# Patient Record
Sex: Female | Born: 1990 | Hispanic: Yes | Marital: Single | Smoking: Never smoker
Health system: Southern US, Community
[De-identification: ages and names within clinical notes are randomized; demographics above are authoritative.]

## PROBLEM LIST (undated history)

## (undated) DIAGNOSIS — F329 Major depressive disorder, single episode, unspecified: Secondary | ICD-10-CM

## (undated) DIAGNOSIS — Z8632 Personal history of gestational diabetes: Secondary | ICD-10-CM

## (undated) DIAGNOSIS — R87629 Unspecified abnormal cytological findings in specimens from vagina: Secondary | ICD-10-CM

## (undated) DIAGNOSIS — Z789 Other specified health status: Secondary | ICD-10-CM

## (undated) DIAGNOSIS — D649 Anemia, unspecified: Secondary | ICD-10-CM

## (undated) DIAGNOSIS — Z87440 Personal history of urinary (tract) infections: Secondary | ICD-10-CM

## (undated) DIAGNOSIS — F32A Depression, unspecified: Secondary | ICD-10-CM

## (undated) HISTORY — DX: Personal history of urinary (tract) infections: Z87.440

## (undated) HISTORY — PX: NO PAST SURGERIES: SHX2092

## (undated) HISTORY — DX: Personal history of gestational diabetes: Z86.32

## (undated) HISTORY — DX: Unspecified abnormal cytological findings in specimens from vagina: R87.629

## (undated) HISTORY — DX: Anemia, unspecified: D64.9

---

## 2012-10-17 DIAGNOSIS — O1490 Unspecified pre-eclampsia, unspecified trimester: Secondary | ICD-10-CM

## 2012-10-17 HISTORY — DX: Unspecified pre-eclampsia, unspecified trimester: O14.90

## 2015-06-15 ENCOUNTER — Other Ambulatory Visit: Payer: Self-pay | Admitting: Advanced Practice Midwife

## 2015-06-15 DIAGNOSIS — Z369 Encounter for antenatal screening, unspecified: Secondary | ICD-10-CM

## 2015-06-28 DIAGNOSIS — Z9149 Other personal history of psychological trauma, not elsewhere classified: Secondary | ICD-10-CM | POA: Insufficient documentation

## 2015-07-11 ENCOUNTER — Ambulatory Visit: Payer: Self-pay

## 2015-07-11 ENCOUNTER — Ambulatory Visit: Admission: RE | Admit: 2015-07-11 | Payer: Self-pay | Source: Ambulatory Visit

## 2015-07-15 ENCOUNTER — Encounter: Payer: Self-pay | Admitting: Emergency Medicine

## 2015-07-15 ENCOUNTER — Emergency Department
Admission: EM | Admit: 2015-07-15 | Discharge: 2015-07-16 | Disposition: A | Discharge status: Home / Self Care | Payer: Self-pay | Attending: Emergency Medicine | Admitting: Emergency Medicine

## 2015-07-15 ENCOUNTER — Other Ambulatory Visit: Payer: Self-pay | Admitting: Advanced Practice Midwife

## 2015-07-15 DIAGNOSIS — Z3490 Encounter for supervision of normal pregnancy, unspecified, unspecified trimester: Secondary | ICD-10-CM

## 2015-07-15 DIAGNOSIS — O9989 Other specified diseases and conditions complicating pregnancy, childbirth and the puerperium: Secondary | ICD-10-CM | POA: Insufficient documentation

## 2015-07-15 DIAGNOSIS — R42 Dizziness and giddiness: Secondary | ICD-10-CM | POA: Insufficient documentation

## 2015-07-15 DIAGNOSIS — R112 Nausea with vomiting, unspecified: Secondary | ICD-10-CM

## 2015-07-15 DIAGNOSIS — R1032 Left lower quadrant pain: Secondary | ICD-10-CM | POA: Insufficient documentation

## 2015-07-15 DIAGNOSIS — Z3A12 12 weeks gestation of pregnancy: Secondary | ICD-10-CM | POA: Insufficient documentation

## 2015-07-15 DIAGNOSIS — O210 Mild hyperemesis gravidarum: Secondary | ICD-10-CM | POA: Insufficient documentation

## 2015-07-15 DIAGNOSIS — Z79899 Other long term (current) drug therapy: Secondary | ICD-10-CM | POA: Insufficient documentation

## 2015-07-15 DIAGNOSIS — Z3402 Encounter for supervision of normal first pregnancy, second trimester: Secondary | ICD-10-CM

## 2015-07-15 NOTE — ED Notes (Signed)
Patient reports being approximately [redacted] weeks pregnant.  States tonight she took an antibiotic for the first time for UTI.  45 minutes prior to that she reports she had taken her prenatal vitamin.  Reports immediately after taking the antibiotic began having abd pain and nausea and vomiting.

## 2015-07-15 NOTE — ED Notes (Signed)
Webster, MD at bedside. 

## 2015-07-16 ENCOUNTER — Emergency Department: Payer: Self-pay

## 2015-07-16 LAB — URINALYSIS COMPLETE WITH MICROSCOPIC (ARMC ONLY)
BILIRUBIN URINE: NEGATIVE
GLUCOSE, UA: 50 mg/dL — AB
Hgb urine dipstick: NEGATIVE
NITRITE: NEGATIVE
Protein, ur: NEGATIVE mg/dL
SPECIFIC GRAVITY, URINE: 1.012 (ref 1.005–1.030)
pH: 7 (ref 5.0–8.0)

## 2015-07-16 LAB — CHLAMYDIA/NGC RT PCR (ARMC ONLY)
CHLAMYDIA TR: NOT DETECTED
N GONORRHOEAE: NOT DETECTED

## 2015-07-16 LAB — COMPREHENSIVE METABOLIC PANEL
ALK PHOS: 115 U/L (ref 38–126)
ALT: 16 U/L (ref 14–54)
ANION GAP: 7 (ref 5–15)
AST: 24 U/L (ref 15–41)
Albumin: 4.3 g/dL (ref 3.5–5.0)
BUN: 5 mg/dL — ABNORMAL LOW (ref 6–20)
CALCIUM: 9.4 mg/dL (ref 8.9–10.3)
CO2: 22 mmol/L (ref 22–32)
CREATININE: 0.46 mg/dL (ref 0.44–1.00)
Chloride: 105 mmol/L (ref 101–111)
GFR calc non Af Amer: >60 mL/min (ref >60)
Glucose, Bld: 91 mg/dL (ref 65–99)
Potassium: 3.2 mmol/L — ABNORMAL LOW (ref 3.5–5.1)
SODIUM: 134 mmol/L — AB (ref 135–145)
Total Bilirubin: 0.2 mg/dL — ABNORMAL LOW (ref 0.3–1.2)
Total Protein: 8.6 g/dL — ABNORMAL HIGH (ref 6.5–8.1)

## 2015-07-16 LAB — HCG, QUANTITATIVE, PREGNANCY: hCG, Beta Chain, Quant, S: 91912 m[IU]/mL — ABNORMAL HIGH (ref <5)

## 2015-07-16 LAB — WET PREP, GENITAL
Clue Cells Wet Prep HPF POC: NONE SEEN
TRICH WET PREP: NONE SEEN
Yeast Wet Prep HPF POC: NONE SEEN

## 2015-07-16 LAB — CBC
HEMATOCRIT: 38.8 % (ref 35.0–47.0)
Hemoglobin: 12.9 g/dL (ref 12.0–16.0)
MCH: 25.8 pg — ABNORMAL LOW (ref 26.0–34.0)
MCHC: 33.2 g/dL (ref 32.0–36.0)
MCV: 77.7 fL — ABNORMAL LOW (ref 80.0–100.0)
Platelets: 404 10*3/uL (ref 150–440)
RBC: 5 MIL/uL (ref 3.80–5.20)
RDW: 14.4 % (ref 11.5–14.5)
WBC: 15 10*3/uL — AB (ref 3.6–11.0)

## 2015-07-16 LAB — ABO/RH: ABO/RH(D): O POS

## 2015-07-16 MED ORDER — ACETAMINOPHEN 325 MG PO TABS
650.0000 mg | ORAL_TABLET | Freq: Once | ORAL | Status: AC
  Administered 2015-07-16: 650 mg via ORAL
  Filled 2015-07-16: qty 2

## 2015-07-16 MED ORDER — METOCLOPRAMIDE HCL 5 MG PO TABS: 5.0000 mg | ORAL_TABLET | Freq: Three times a day (TID) | ORAL | Status: DC | PRN

## 2015-07-16 MED ORDER — METOCLOPRAMIDE HCL 5 MG/ML IJ SOLN
10.0000 mg | Freq: Once | INTRAMUSCULAR | Status: AC
  Administered 2015-07-16: 10 mg via INTRAVENOUS
  Filled 2015-07-16: qty 2

## 2015-07-16 MED ORDER — SODIUM CHLORIDE 0.9 % IV BOLUS (SEPSIS)
1000.0000 mL | Freq: Once | INTRAVENOUS | Status: AC
  Administered 2015-07-16: 1000 mL via INTRAVENOUS

## 2015-07-16 NOTE — ED Provider Notes (Signed)
[facility] Emergency Department Provider Note  ____________________________________________  Time seen: Approximately 2345 PM  I have reviewed the triage vital signs and the nursing notes.   HISTORY  Chief Complaint Abdominal Pain and Emesis    HPI Brittany Becker is a 24 y.o. female who comes into the hospital today with abdominal pain, nausea and vomiting. The patient is [redacted] weeks pregnant with her first child and was her doctor's office today.The patient received her flu shot, a PPD placed and was placed on antibiotics for a urinary tract infection. The patient reports that tonight she took her first dose of medication and she started feeling dizzy with vomiting and LLQ pain. The patient rates the pain as a 10/10 in intensity. She reports that 4 years ago she have cysts on her ovaries, cervix and uterus and was told that she would not be able to have children. The patient reports that she has never had this pain in the past. The patient also has a history of an abnormal pap smear. The patient and her mother were concerned about a reaction to the medication she she decided to come in for evaluation.   History reviewed. No pertinent past medical history.  There are no active problems to display for this patient.   History reviewed. No pertinent past surgical history.  Current Outpatient Rx  Name  Route  Sig  Dispense  Refill  . nitrofurantoin (MACRODANTIN) 100 MG capsule   Oral   Take 100 mg by mouth 2 (two) times daily. For 7 days         . Prenatal Vit-Fe Fumarate-FA (PRENATAL MULTIVITAMIN) TABS tablet   Oral   Take 1 tablet by mouth daily at 12 noon.         . metoCLOPramide (REGLAN) 5 MG tablet   Oral   Take 1 tablet (5 mg total) by mouth every 8 (eight) hours as needed for nausea or vomiting.   15 tablet   0     Allergies Review of patient's allergies indicates no known allergies.  No family history on file.  Social  History Social History  Substance Use Topics  . Smoking status: Never Smoker   . Smokeless tobacco: None  . Alcohol Use: No    Review of Systems Constitutional: No fever/chills Eyes: No visual changes. ENT: No sore throat. Cardiovascular: Denies chest pain. Respiratory: Denies shortness of breath. Gastrointestinal: abdominal pain.   nausea,  vomiting.  No diarrhea.  No constipation. Genitourinary: Negative for dysuria. Musculoskeletal: Negative for back pain. Skin: Negative for rash. Neurological: dizziness 10-point ROS otherwise negative.  ____________________________________________   PHYSICAL EXAM:  VITAL SIGNS: ED Triage Vitals  Enc Vitals Group     BP 07/15/15 2346 126/80 mmHg     Pulse Rate 07/15/15 2315 83     Resp 07/15/15 2313 20     Temp 07/15/15 2313 98.2 F (36.8 C)     Temp Source 07/15/15 2313 Oral     SpO2 07/15/15 2315 98 %     Weight 07/15/15 2313 143 lb (64.864 kg)     Height 07/15/15 2313 5' (1.524 m)     Head Cir --      Peak Flow --      Pain Score 07/15/15 2315 8     Pain Loc --      Pain Edu? --      Excl. in [place]? --     Constitutional: Alert and oriented. Well appearing and in  moderate distress. Eyes: Conjunctivae are normal. PERRL. EOMI. Head: Atraumatic. Nose: No congestion/rhinnorhea. Mouth/Throat: Mucous membranes are moist.  Oropharynx non-erythematous. Cardiovascular: Normal rate, regular rhythm. Grossly normal heart sounds.  Good peripheral circulation. Respiratory: Normal respiratory effort.  No retractions. Lungs CTAB. Gastrointestinal: Soft and nontender. No distention. Positive bowel sounds Genitourinary: normal external genitalia, scant discharge, lesion on cervix. Musculoskeletal: No lower extremity tenderness nor edema.   Neurologic:  Normal speech and language.  Skin:  Skin is warm, dry and intact. Psychiatric: Mood and affect are normal.   ____________________________________________   LABS (all labs ordered are  listed, but only abnormal results are displayed)  Labs Reviewed  WET PREP, GENITAL - Abnormal; Notable for the following:    WBC, Wet Prep HPF POC MODERATE (*)    All other components within normal limits  CBC - Abnormal; Notable for the following:    WBC 15.0 (*)    MCV 77.7 (*)    MCH 25.8 (*)    All other components within normal limits  HCG, QUANTITATIVE, PREGNANCY - Abnormal; Notable for the following:    hCG, Beta Chain, Quant, S 91912 (*)    All other components within normal limits  URINALYSIS COMPLETEWITH MICROSCOPIC ([facility] ONLY) - Abnormal; Notable for the following:    Color, Urine AMBER (*)    APPearance HAZY (*)    Glucose, UA 50 (*)    Ketones, ur 1+ (*)    Leukocytes, UA 2+ (*)    Bacteria, UA RARE (*)    Squamous Epithelial / LPF 6-30 (*)    All other components within normal limits  COMPREHENSIVE METABOLIC PANEL - Abnormal; Notable for the following:    Sodium 134 (*)    Potassium 3.2 (*)    BUN 5 (*)    Total Protein 8.6 (*)    Total Bilirubin 0.2 (*)    All other components within normal limits  CHLAMYDIA/NGC RT PCR ([facility] ONLY)  ABO/RH   ____________________________________________  EKG  none ____________________________________________  RADIOLOGY  US ob: Single IUP, estimated gestational age [redacted] weeks, 5 days ____________________________________________   PROCEDURES  Procedure(s) performed: None  Critical Care performed: No  ____________________________________________   INITIAL IMPRESSION / ASSESSMENT AND PLAN / ED COURSE  Pertinent labs & imaging results that were available during my care of the patient were reviewed by me and considered in my medical decision making (see chart for details).  This is a 24 year old female who comes in today with lower abdominal pain after starting on nitrofurantoin. The concern is that the patient who has not yet had an ultrasound may have an ectopic or ruptured cyst or twisting cyst causing  her symptoms. I did give the patient a dose of Tylenol for pain as well as Reglan for nausea. The pain did improve after the Tylenol. I informed the patient is a results of the ultrasound and informed her that at this point I do not know why she is having the symptoms she should follow back up with her primary care physician. The patient and her mother agrees. The patient will be discharged home. ____________________________________________   FINAL CLINICAL IMPRESSION(S) / ED DIAGNOSES  Final diagnoses:  LLQ pain  Non-intractable vomiting with nausea, vomiting of unspecified type  Pregnancy      Loney Hering, MD 07/16/15 [phone]

## 2015-07-16 NOTE — ED Notes (Addendum)
Pt's BP noted to be much lower than it was upon arrival; MD made aware and new orders received for 1L NS bolus.

## 2015-07-16 NOTE — Discharge Instructions (Signed)
Dolor abdominal en adultos (Abdominal Pain, Adult) El dolor puede tener muchas causas. Normalmente la causa del dolor abdominal no es una enfermedad y Teacher, English as a foreign language sin Clinical research associate. Frecuentemente puede controlarse y tratarse en casa. Su mdico le Chartered certified accountant examen fsico y posiblemente solicite anlisis de sangre y radiografas para ayudar a Teacher, adult education la gravedad de su dolor. Sin embargo, en [employer], debe transcurrir ms tiempo antes de que se pueda Pension scheme manager una causa evidente del dolor. Antes de llegar a ese punto, es posible que su mdico no sepa si necesita ms pruebas o un tratamiento ms profundo. INSTRUCCIONES PARA EL CUIDADO EN EL HOGAR  Est atento al dolor para ver si hay cambios. Las siguientes indicaciones ayudarn a Chief Strategy Officer que pueda sentir:  [place] solo medicamentos de venta libre o recetados, segn las indicaciones del mdico.  No tome laxantes a menos que se lo haya indicado su mdico.  Pruebe con Ardelia Mems dieta lquida absoluta (caldo, t o agua) segn se lo indique su mdico. Introduzca gradualmente una dieta normal, segn su tolerancia. SOLICITE ATENCIN MDICA SI:  Tiene dolor abdominal sin explicacin.  Tiene dolor abdominal relacionado con nuseas o diarrea.  Tiene dolor cuando orina o defeca.  Experimenta dolor abdominal que lo despierta de noche.  Tiene dolor abdominal que empeora o mejora cuando come alimentos.  Tiene dolor abdominal que empeora cuando come alimentos grasosos.  Tiene fiebre. SOLICITE ATENCIN MDICA DE INMEDIATO SI:   El dolor no desaparece en un plazo mximo de 2horas.  No deja de (vomitar).  El Social research officer, government se siente solo en partes del abdomen, como el lado derecho o la parte inferior izquierda del abdomen.  Evaca materia fecal sanguinolenta o negra, de aspecto alquitranado. ASEGRESE DE QUE:  Comprende estas instrucciones.  Controlar su afeccin.  Recibir ayuda de inmediato si no mejora o si empeora.   Esta  informacin no tiene Marine scientist el consejo del mdico. Asegrese de hacerle al mdico cualquier pregunta que tenga.   Document Released: 09/17/2005 Document Revised: 10/08/2014 Elsevier Interactive Patient Education 2016 [place] trimestre de Media planner (First Trimester of Pregnancy) El primer trimestre de Media planner se extiende desde la semana1 hasta el final de la semana12 (mes1 al mes3). Una semana despus de que un espermatozoide fecunda un vulo, este se implantar en la pared uterina. Este embrin comenzar a Medical laboratory scientific officer convertirse en un beb. Sus genes y los de su pareja forman el beb. Los genes del varn determinan si ser un nio o una nia. Entre la semana6 y [place], se forman los ojos y [place], y los latidos del corazn pueden verse en la ecografa. Al final de las 12semanas, todos los rganos del beb estn formados.  Ahora que est embarazada, querr hacer todo lo que est a su alcance para tener un beb sano. Dos de las cosas ms importantes son Lucilla Edin buena atencin prenatal y seguir las indicaciones del mdico. La atencin prenatal incluye toda la asistencia mdica que usted recibe antes del nacimiento del beb. Esta ayudar a prevenir, Hydrographic surveyor y tratar cualquier problema durante el embarazo y [place]. CAMBIOS EN EL ORGANISMO Su organismo atraviesa por muchos cambios durante el [place], y estos varan de Ardelia Mems mujer a Theatre manager.   Al principio, puede aumentar o bajar algunos kilos.  Puede tener Higher education careers adviser (nuseas) y vomitar. Si no puede controlar los vmitos, llame al mdico.  Puede cansarse con facilidad.  Es posible que tenga dolores de cabeza que pueden aliviarse con los [employer]  el mdico le permita tomar.  Puede orinar con mayor frecuencia. El dolor al orinar puede significar que usted tiene una infeccin de la vejiga.  Debido al [place], puede tener acidez estomacal.  Puede estar estreida, ya que ciertas hormonas  enlentecen los movimientos de los msculos que [employer] desechos a travs de los intestinos.  Pueden aparecer hemorroides o abultarse e hincharse las venas (venas varicosas).  Las [employer] pueden empezar a Engineer, site y Scientist, forensic. Los pezones pueden sobresalir ms, y el tejido que los rodea (areola) tornarse ms oscuro.  Las Production manager y estar sensibles al cepillado y al hilo dental.  Pueden aparecer zonas oscuras o manchas (cloasma, mscara del Media planner) en el rostro que probablemente se atenuarn despus del nacimiento del beb.  Los perodos menstruales se interrumpirn.  Tal vez no tenga apetito.  Puede sentir un fuerte deseo de consumir ciertos alimentos.  Puede tener cambios a Engineer, site a da, por ejemplo, por momentos puede estar emocionada por el Media planner y por otros preocuparse porque algo pueda salir mal con el embarazo o el beb.  Tendr sueos ms vvidos y extraos.  Tal vez haya cambios en el cabello que pueden incluir su engrosamiento, crecimiento rpido y cambios en la textura. A algunas mujeres tambin se les cae el cabello durante o despus del [place], o tienen el cabello seco o fino. Lo ms probable es que el cabello se le normalice despus del nacimiento del beb. QU DEBE ESPERAR EN LAS CONSULTAS PRENATALES Durante una visita prenatal de rutina:  La pesarn para asegurarse de que usted y el beb estn creciendo normalmente.  Le controlarn la presin arterial.  Le medirn el abdomen para controlar el desarrollo del beb.  Se escucharn los latidos cardacos a partir de la semana10 o la12 de embarazo, aproximadamente.  Se analizarn los resultados de los estudios solicitados en visitas anteriores. El mdico puede preguntarle:  Cmo se siente.  Si siente los movimientos del beb.  Si ha tenido sntomas anormales, como prdida de lquido, [place], dolores de cabeza intensos o clicos abdominales.  Si est consumiendo algn  producto que contenga tabaco, como cigarrillos, tabaco de Higher education careers adviser y Psychologist, sport and exercise.  Si tiene [employer]. Otros estudios que pueden realizarse durante el primer trimestre incluyen lo siguiente:  Anlisis de sangre para determinar el tipo de sangre y Hydrographic surveyor la presencia de infecciones previas. Adems, se los usar para controlar si los niveles de hierro son bajos (anemia) y Teacher, adult education los anticuerpos Rh. En una etapa ms avanzada del [place], se harn anlisis de sangre para saber si tiene diabetes, junto con otros estudios si surgen problemas.  Anlisis de orina para detectar infecciones, diabetes o protenas en la orina.  Una ecografa para confirmar que el beb crece y se desarrolla correctamente.  Una amniocentesis para diagnosticar posibles problemas genticos.  Estudios del feto para descartar espina bfida y sndrome de [place].  Es posible que necesite otras pruebas adicionales.  Prueba del VIH (virus de inmunodeficiencia humana). Los exmenes prenatales de rutina incluyen la prueba de deteccin del VIH, a menos que decida no Radiation protection practitioner. INSTRUCCIONES PARA EL CUIDADO EN EL HOGAR  Medicamentos:  Siga las indicaciones del mdico en relacin con el uso de medicamentos. Durante el embarazo, hay medicamentos que pueden tomarse y 7 que no.  Tome las vitaminas prenatales como se le indic.  Si est estreida, tome un laxante suave, si el mdico lo [place]. Dieta  Consuma alimentos balanceados. Elija alimentos variados, como carne o protenas de  origen vegetal, pescado, leche y productos lcteos descremados, verduras, frutas y panes y Psychologist, prison and probation services. El mdico la ayudar a Office manager cantidad de peso que puede [place].  No coma carne cruda ni quesos sin cocinar. Estos elementos contienen bacterias que pueden causar defectos congnitos en el beb.  La ingesta diaria de cuatro o cinco comidas pequeas en lugar de tres comidas abundantes puede ayudar a  [employer] nuseas y los vmitos. Si empieza a tener nuseas, comer algunas galletas saladas puede ser de [place]. Beber lquidos [employer] comidas en lugar de tomarlos durante las comidas tambin puede ayudar a Actor las nuseas y los vmitos.  Si est estreida, consuma alimentos con alto contenido de fibra, como verduras y frutas frescas, y Psychologist, prison and probation services. Beba suficiente lquido para Consulting civil engineer orina clara o de color amarillo plido. Actividad y Conservation officer, historic buildings ejercicio solamente como se lo haya indicado el mdico. El ejercicio la ayudar a:  Technical sales engineer.  Mantenerse en forma.  Estar preparada para el trabajo de parto y [place].  Los dolores, los clicos en la parte baja del abdomen o los calambres en la cintura son un buen indicio de que debe dejar de Insurance risk surveyor. Consulte al mdico antes de seguir haciendo ejercicios normales.  Intente no estar de pie [employer]. Mueva las piernas con frecuencia si debe estar de pie en un lugar durante mucho tiempo.  Evite levantar pesos [employer].  Use zapatos de tacones bajos y [place].  Puede seguir teniendo [employer], excepto que el mdico le indique lo contrario. Alivio del dolor o las molestias  Use un sostn que le brinde buen soporte si siente dolor a la palpacin [employer].  Dese baos de asiento con agua tibia para Best boy o las molestias causadas por las hemorroides. Use crema antihemorroidal si el mdico se lo permite.  Descanse con las piernas elevadas si tiene calambres o dolor de cintura.  Si tiene venas varicosas en las piernas, use medias de descanso. Eleve los pies durante 49minutos, 3 o 4veces por da. Limite la cantidad de sal en su dieta. Cuidados prenatales  Programe las visitas prenatales para la semana12 de [place]. Generalmente se programan cada mes al principio y se hacen ms frecuentes en los 2 ltimos meses antes del parto.  Escriba sus  preguntas. Llvelas cuando concurra a las visitas prenatales.  Concurra a todas las visitas prenatales como se lo haya indicado el mdico. Seguridad  Colquese el cinturn de seguridad cuando conduzca.  Haga una lista de los nmeros de telfono de Freight forwarder, que [employer]J's nmeros de telfono de familiares, [place], el hospital y los departamentos de polica y bomberos. Consejos generales  Pdale al mdico que la derive a clases de educacin prenatal en su localidad. Debe comenzar a tomar las clases antes de Dietitian en el mes6 de embarazo.  Pida ayuda si tiene necesidades nutricionales o de asesoramiento Solicitor. El mdico puede aconsejarla o derivarla a especialistas para que la ayuden con diferentes necesidades.  No se d baos de inmersin en agua caliente, baos turcos ni saunas.  No se haga duchas vaginales ni use tampones o toallas higinicas perfumadas.  No mantenga las piernas cruzadas durante mucho tiempo.  Evite el contacto con las bandejas sanitarias de los gatos y la tierra que estos animales usan. Estos elementos contienen bacterias que pueden causar defectos congnitos al beb y la posible prdida del feto debido a un aborto espontneo o [employer]  fetal.  No fume, no consuma hierbas ni medicamentos que no hayan sido recetados por el mdico. Las sustancias qumicas que estos productos contienen afectan la formacin y el desarrollo del beb.  No consuma ningn producto que contenga tabaco, lo que incluye cigarrillos, tabaco de Higher education careers adviser y Psychologist, sport and exercise. Si necesita ayuda para dejar de fumar, consulte al [employer]. Puede recibir asesoramiento y otro tipo de recursos para dejar de fumar.  Programe una cita con el dentista. En su casa, lvese los dientes con un cepillo dental blando y psese el hilo dental con suavidad. SOLICITE ATENCIN MDICA SI:   Tiene mareos.  Siente clicos leves, presin en la pelvis o dolor persistente en el abdomen.  Tiene nuseas,  vmitos o diarrea persistentes.  Tiene secrecin vaginal con mal olor.  Siente dolor al [employer].  Tiene el rostro, las [place], las piernas o los tobillos ms hinchados. SOLICITE ATENCIN MDICA DE INMEDIATO SI:   Tiene fiebre.  Tiene una prdida de lquido por la vagina.  Tiene sangrado o pequeas prdidas vaginales.  Siente dolor intenso o clicos en el abdomen.  Sube o baja de peso rpidamente.  Vomita sangre de color rojo brillante o material que parezca granos de caf.  Ha estado expuesta a la rubola y no ha sufrido la enfermedad.  Ha estado expuesta a la quinta enfermedad o a la varicela.  Tiene un dolor de cabeza intenso.  Le falta el aire.  Sufre cualquier tipo de traumatismo, por ejemplo, debido a una cada o un accidente automovilstico.   Esta informacin no tiene Marine scientist el consejo del mdico. Asegrese de hacerle al mdico cualquier pregunta que tenga.   Document Released: 06/27/2005 Document Revised: 10/08/2014 Elsevier Interactive Patient Education 2016 [place] y vmitos (Nausea and Vomiting)  Naseas significa que tiene ganas de vomitar. Elvmitoes un reflejo por el que los contenidos del estmago salen por la boca.  CUIDADOS EN EL HOGAR  Tome los medicamentos como le indic el mdico.  No se esfuerce por comer. Sin embargo, es necesario que tome lquidos.  Si tiene ganas de comer, [place] dieta normal, segn las indicaciones del mdico.  Coma arroz, trigo, patatas, pan, carnes magras, yogur, frutas y vegetales.  Evite los alimentos [employer].  Beba gran cantidad de lquidos para mantener la orina de tono claro o color amarillo plido.  Consulte a su mdico como reponer la prdida de lquidos (rehidratacin). Los signos de prdida de lquidos (deshidratacin) son:  Catering manager sed.  Tiene los labios y la boca secos.  Est mareado.  La orina es [place].  Orina menos que lo normal.  Se siente  confundido.  La respiracin o la frecuencia cardaca son rpidas. SOLICITE AYUDA DE INMEDIATO SI:  Observa sangre en su vmito.  La materia fecal (heces)es negra o de color rojo.  Siente un dolor de cabeza muy intenso o el cuello rgido.  Se siente confundido.  Siente dolor muy fuerte en el vientre (abdominal).  Tiene dolor en el pecho o dificultad para respirar.  No ha orinado durante 8 horas.  Tiene la piel fra y pegajosa.  Sigue vomitando despus de 24  48 horas.  Tiene fiebre. ASEGRESE QUE:  Comprende estas instrucciones.  Controlar la enfermedad.  Puede solicitar ayuda de inmediato si los sntomas no mejoran, o si empeoran.   Esta informacin no tiene Marine scientist el consejo del mdico. Asegrese de hacerle al mdico cualquier pregunta que tenga.   Document Released: 03/07/2010 Document Revised: 12/10/2011  Elsevier Interactive Patient Education ©2016 Elsevier Inc. ° °

## 2015-07-29 ENCOUNTER — Ambulatory Visit: Admission: RE | Admit: 2015-07-29 | Payer: Self-pay | Source: Ambulatory Visit

## 2015-08-22 ENCOUNTER — Ambulatory Visit
Admission: RE | Admit: 2015-08-22 | Discharge: 2015-08-22 | Disposition: A | Discharge status: Home / Self Care | Payer: Self-pay | Source: Ambulatory Visit | Attending: Maternal & Fetal Medicine | Admitting: Maternal & Fetal Medicine

## 2015-08-22 VITALS — BP 105/62 | HR 75 | Temp 98.0°F | Wt 146.0 lb

## 2015-08-22 DIAGNOSIS — Z3A18 18 weeks gestation of pregnancy: Secondary | ICD-10-CM | POA: Insufficient documentation

## 2015-08-22 DIAGNOSIS — D473 Essential (hemorrhagic) thrombocythemia: Secondary | ICD-10-CM | POA: Insufficient documentation

## 2015-08-22 DIAGNOSIS — O26892 Other specified pregnancy related conditions, second trimester: Secondary | ICD-10-CM | POA: Insufficient documentation

## 2015-08-22 DIAGNOSIS — R7989 Other specified abnormal findings of blood chemistry: Secondary | ICD-10-CM

## 2015-08-22 HISTORY — DX: Major depressive disorder, single episode, unspecified: F32.9

## 2015-08-22 HISTORY — DX: Other specified health status: Z78.9

## 2015-08-22 HISTORY — DX: Depression, unspecified: F32.A

## 2015-08-22 LAB — CBC WITH DIFFERENTIAL/PLATELET
Basophils Absolute: 0 10*3/uL (ref 0–0.1)
Basophils Relative: 0 %
EOS ABS: 0 10*3/uL (ref 0–0.7)
EOS PCT: 0 %
HCT: 35.2 % (ref 35.0–47.0)
Hemoglobin: 11.7 g/dL — ABNORMAL LOW (ref 12.0–16.0)
LYMPHS ABS: 3 10*3/uL (ref 1.0–3.6)
Lymphocytes Relative: 28 %
MCH: 26 pg (ref 26.0–34.0)
MCHC: 33.1 g/dL (ref 32.0–36.0)
MCV: 78.4 fL — ABNORMAL LOW (ref 80.0–100.0)
MONO ABS: 0.7 10*3/uL (ref 0.2–0.9)
MONOS PCT: 6 %
Neutro Abs: 6.9 10*3/uL — ABNORMAL HIGH (ref 1.4–6.5)
Neutrophils Relative %: 66 %
PLATELETS: 433 10*3/uL (ref 150–440)
RBC: 4.49 MIL/uL (ref 3.80–5.20)
RDW: 14.5 % (ref 11.5–14.5)
WBC: 10.6 10*3/uL (ref 3.6–11.0)

## 2015-08-22 LAB — FERRITIN: FERRITIN: 44 ng/mL (ref 11–307)

## 2015-08-22 NOTE — Progress Notes (Addendum)
[facility] Consultation   Chief Complaint: increased platelet count  HPI: Brittany Becker is a 24 y.o. G1P0 at [redacted]w[redacted]d by LMP who presents in consultation from the [facility] for an increased platelet count.  Monthly serial platelet counts have ranged from 402 to 460.    Gynecologic History:  Patient's last menstrual period was 04/15/2015 (exact date).  The patient was told, based an [place] in [place] that she had ovarian cysts and may not conceive. She had involuntary infertility for 3 years.  Past Medical History: Patient  has a past medical history of Medical history non-contributory and Depression. She sees a Engineer, water at [address].  Her current mood is good and she is not taking any medication.  Past Surgical History: She  has past surgical history that includes No past surgeries.   Medications: PN vitamins  Allergies: Patient has No Known Allergies. She does report having nausea and vomiting after taking an antibiotic this pregnancy for UTI.    Social History: Patient  reports that she has never smoked. She has never used smokeless tobacco. She reports that she does not drink alcohol or use illicit drugs.  The patient is not working currently.  She moved here 05/11/2015 (15 weeks ago) with her nieces.  Her partner remains in [place] and she has not seen him since she left.  Family History: family history includes Heart disease in her other; Mood Disorder in her mother.   Review of Systems A full 12 point review of systems was negative or as noted in the History of Present Illness.  Physical Exam: BP 105/62 mmHg  Pulse 75  Temp(Src) 98 F (36.7 C)  Wt 146 lb (66.225 kg)  LMP 04/15/2015 (Exact Date) Fundal height is at the umbilicus FHR [id number]  CBC 99991111 - Hb 12.6, Hct 37.4, WBC 12.7, and platelets 437K  Asessement: 24 yo gravida 1 at [redacted]w[redacted]d by dates with: 1. Recent travel from [place] 15 weeks ago 2. Increased platelet count 3. LGSIL  Pap  Recommendations: 1. Recent travel from [place] 15 weeks ago --  The patient reports having had Zika testing at [facility] --  Since she is beyond 12 weeks from exposure, we would not test her now --  We will obtain growth measurements and study intracranial anatomy at the time of a detailed ultrasound.  She was scheduled for this. --  We would suggest an ultrasound for fetal growth at 34-[redacted] weeks gestation 2. Increased platelet count --  Via the interpreter, the patient was counseled about the role of platelets in blood clotting --  The patient was counseled that a normal platelet count in pregnancy is 225,000-250,000 --  Her platelet count is not yet sufficiently elevated to justify a hematological evaluation, but may place her at increased risk of thrombosis and adverse fetal outcomes.  I recommended ASA 81 mg per day and gave her a prescription. --  The patient was counseled that the most common reason for thrombocytosis is reactive thrombocytosis, usually due to anemia.  While the patient is not anemic, she may be iron deficient.  We will obtain a ferritin, CBC, peripheral smear and hemoglobin electrophoresis today. --  She should have follow-up CBC every 4-8 weeks.  If her platelet count exceeds 500,000, additional therapy may be required and I would suggest consultation with Dr. Grayland Ormond, hematologist at [facility].  3. LGSIL Pap -- Follow-up per [facility]  Total time spent with the patient was 40 minutes with greater than 50% spent  in counseling and coordination of care.  We appreciate this interesting consult and will be happy to be involved in the ongoing care of Brittany Becker in anyway her obstetricians desire.  Erasmo Score, MD [facility]  Addendum:  Platelet count 433; essentially normal smear, ferritin 44 (normal); Hb electrophoresis - normal  Erasmo Score, MD [facility]

## 2015-08-23 LAB — HEMOGLOBINOPATHY EVALUATION
HGB A2 QUANT: 2.6 % (ref 0.7–3.1)
Hgb A: 97.4 % (ref 94.0–98.0)
Hgb C: 0 %
Hgb F Quant: 0 % (ref 0.0–2.0)
Hgb S Quant: 0 %

## 2015-08-23 LAB — PATHOLOGIST SMEAR REVIEW

## 2015-09-05 ENCOUNTER — Other Ambulatory Visit: Payer: Self-pay

## 2015-09-05 ENCOUNTER — Ambulatory Visit
Admission: RE | Admit: 2015-09-05 | Discharge: 2015-09-05 | Disposition: A | Discharge status: Home / Self Care | Payer: Self-pay | Source: Ambulatory Visit | Attending: Obstetrics & Gynecology | Admitting: Obstetrics & Gynecology

## 2015-09-05 DIAGNOSIS — O352XX1 Maternal care for (suspected) hereditary disease in fetus, fetus 1: Secondary | ICD-10-CM | POA: Insufficient documentation

## 2015-09-05 DIAGNOSIS — Z3A20 20 weeks gestation of pregnancy: Secondary | ICD-10-CM | POA: Insufficient documentation

## 2015-09-05 DIAGNOSIS — O359XX1 Maternal care for (suspected) fetal abnormality and damage, unspecified, fetus 1: Secondary | ICD-10-CM

## 2015-09-05 DIAGNOSIS — D473 Essential (hemorrhagic) thrombocythemia: Secondary | ICD-10-CM | POA: Insufficient documentation

## 2015-09-05 DIAGNOSIS — Z36 Encounter for antenatal screening of mother: Secondary | ICD-10-CM | POA: Insufficient documentation

## 2015-09-12 NOTE — Addendum Note (Signed)
Encounter addended by: Dellia Nims, MD on: 09/12/2015  9:11 AM<BR>     Documentation filed: Notes Section

## 2015-11-01 LAB — HM HIV SCREENING LAB: HM HIV Screening: NEGATIVE

## 2015-11-21 DIAGNOSIS — R87612 Low grade squamous intraepithelial lesion on cytologic smear of cervix (LGSIL): Secondary | ICD-10-CM | POA: Insufficient documentation

## 2015-12-12 ENCOUNTER — Ambulatory Visit: Payer: Self-pay

## 2016-06-26 ENCOUNTER — Encounter (HOSPITAL_COMMUNITY): Payer: Self-pay

## 2017-07-07 IMAGING — US US OB COMP LESS 14 WK
1 series · 14 of 28 positions shown · non-contrast
Comparison: None.

CLINICAL DATA: Left lower quadrant pain for 5 hours. Estimated
gestational age by LMP is 13 weeks 1 day. Quantitative beta HCG is
[DATE].

EXAM:
OBSTETRIC <14 WK ULTRASOUND
TECHNIQUE: Transabdominal ultrasound was performed for evaluation of the
gestation as well as the maternal uterus and adnexal regions.

[Series 1: us ob comp less 14 wk · 0.20mm/px · 14 of 46 slices shown]
[im 2/46]
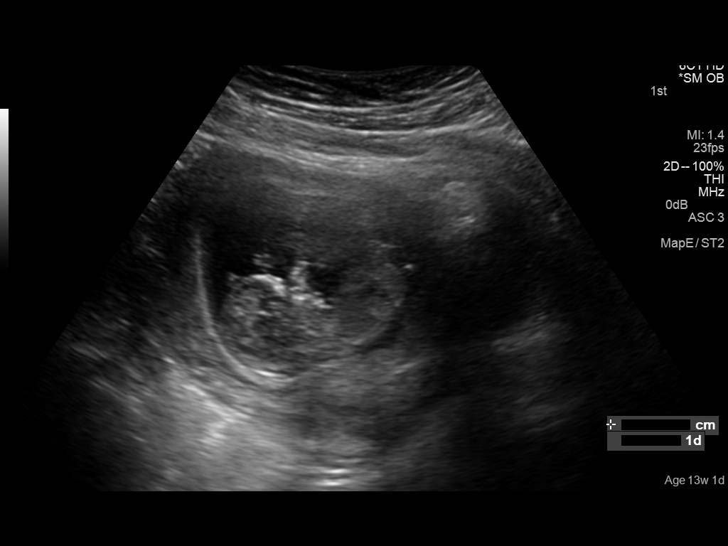
[im 6/46]
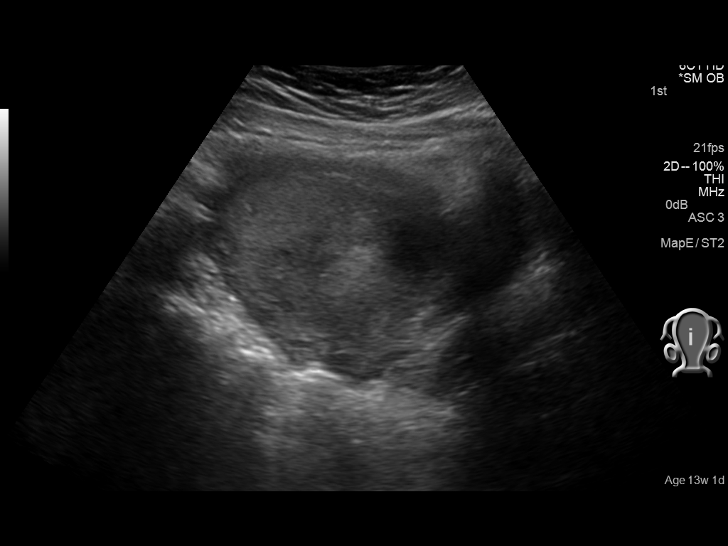
[im 9/46]
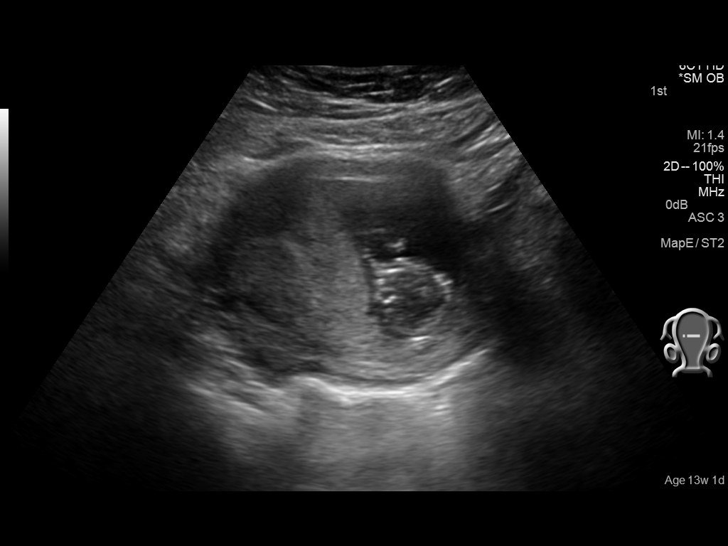
[im 12/46]
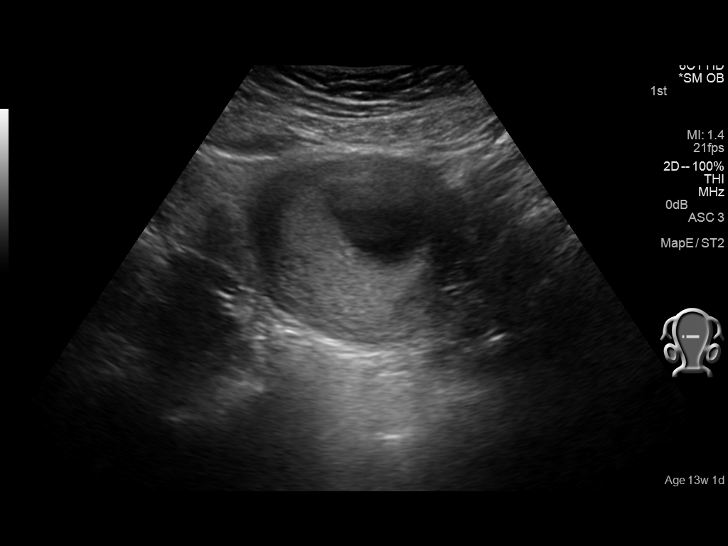
[im 16/46]
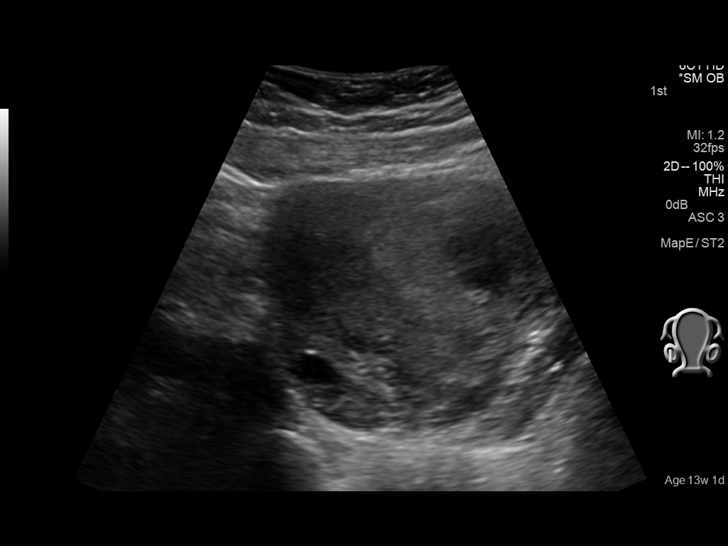
[im 19/46]
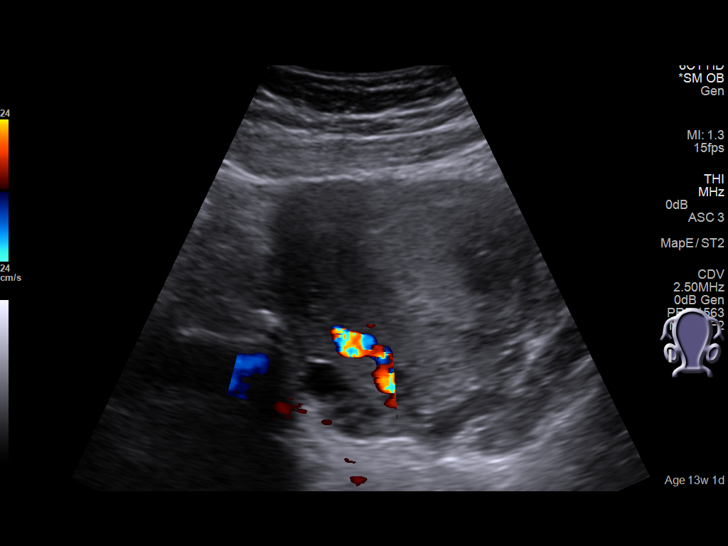
[im 22/46]
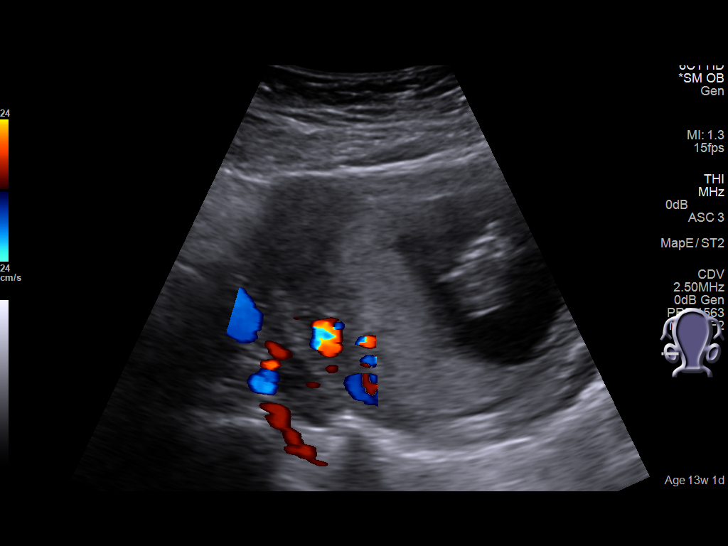
[im 26/46]
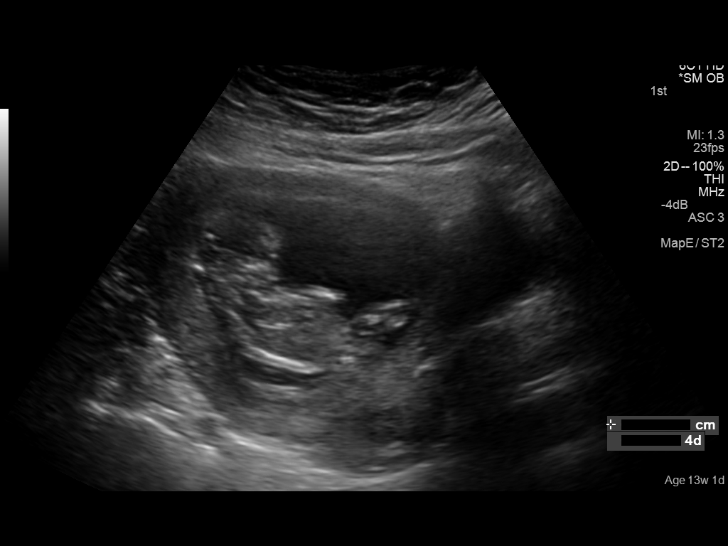
[im 29/46]
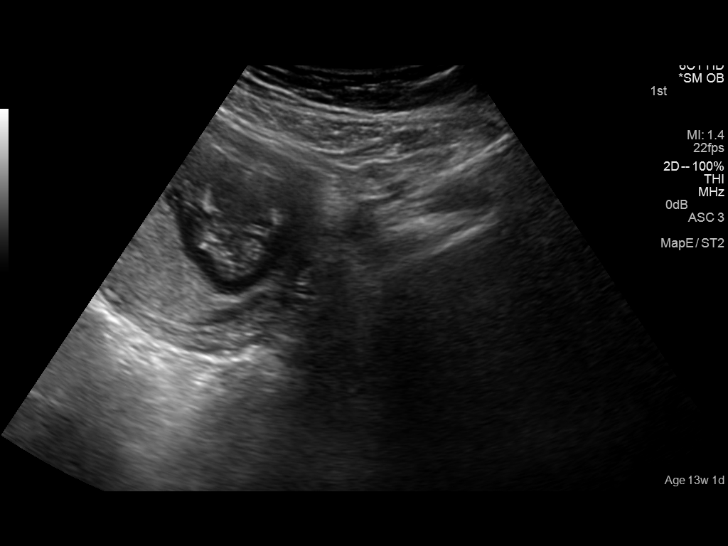
[im 32/46]
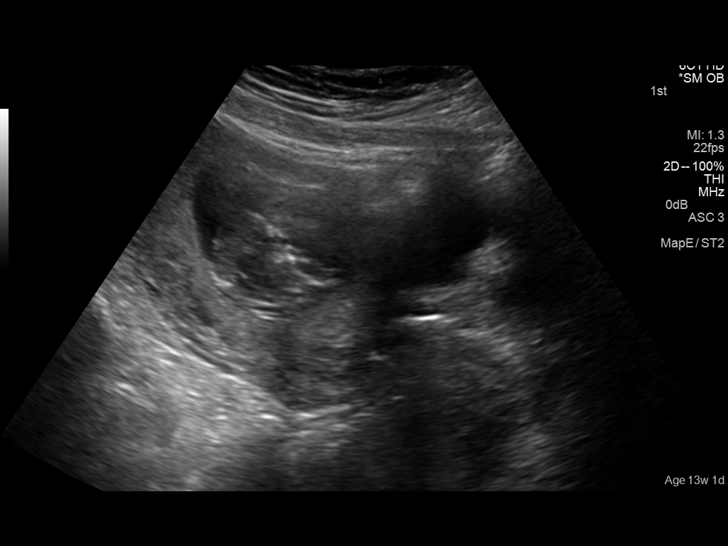
[im 36/46]
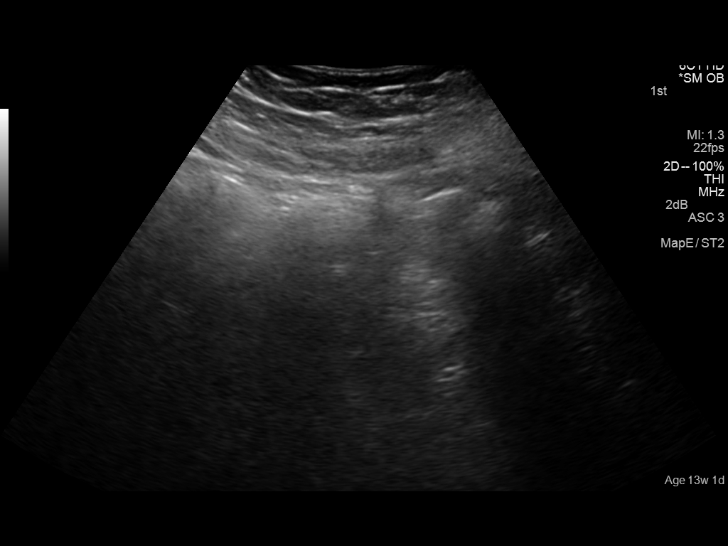
[im 39/46]
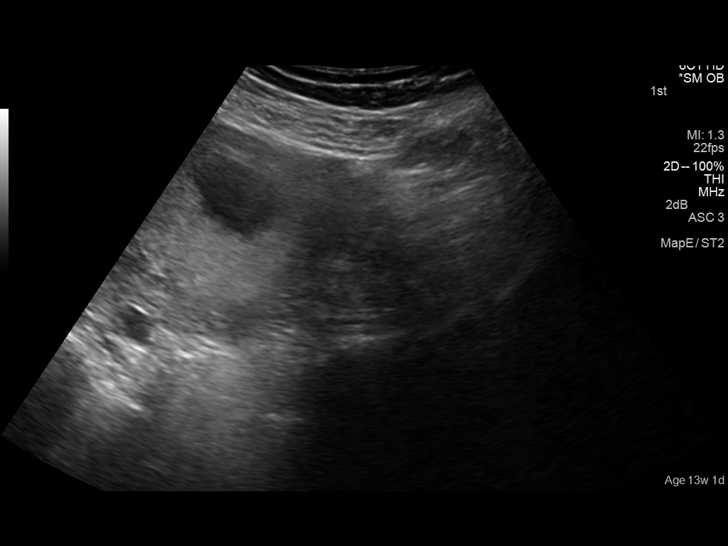
[im 42/46]
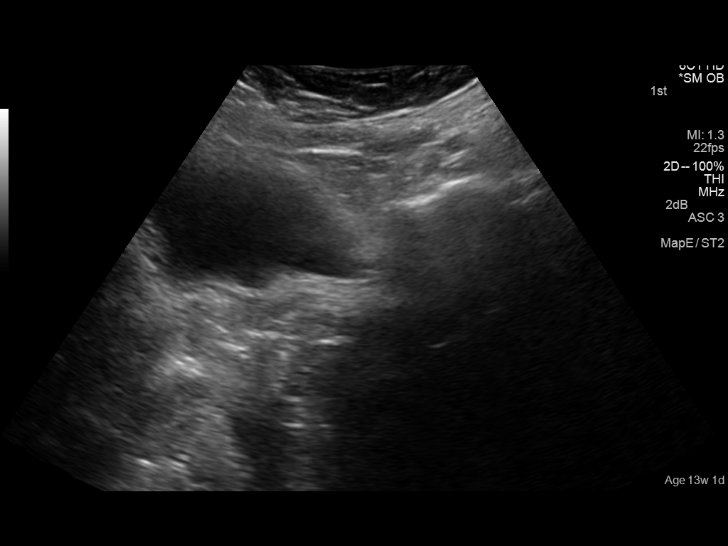
[im 46/46]
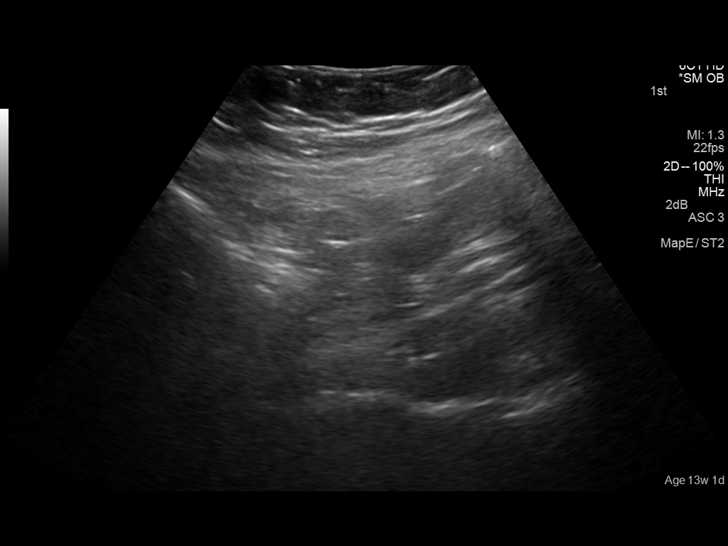

[14 of 28 positions shown; findings below may reference images not displayed]

FINDINGS: Intrauterine gestational sac: A single intrauterine pregnancy is
demonstrated.

Yolk sac:  Yolk sac is not present consistent with gestational age.

Embryo:  Fetal pole is present.

Cardiac Activity: Fetal cardiac activity is observed.

Heart Rate: 169 bpm

CRL:   62.8  mm   12 w 5 d                  US EDC: 01/23/2016

Maternal uterus/adnexae: No myometrial mass lesions identified.
Limited visualization of the cervix appears intact. Right ovary is
visualized with normal follicular changes. Left ovary is not
identified. Gas obstructs visualization of the left adnexa. No
abnormal adnexal masses demonstrated. No free fluid in the pelvis.
IMPRESSION: Single intrauterine pregnancy. Estimated gestational age by
crown-rump length is 12 weeks 5 days. No acute complication is
suggested.

## 2018-06-17 LAB — HM PAP SMEAR

## 2018-07-07 ENCOUNTER — Encounter: Payer: Self-pay | Admitting: *Deleted

## 2018-07-07 ENCOUNTER — Other Ambulatory Visit (HOSPITAL_COMMUNITY): Admission: RE | Admit: 2018-07-07 | Payer: Self-pay | Source: Ambulatory Visit | Admitting: Obstetrics & Gynecology

## 2018-07-07 ENCOUNTER — Other Ambulatory Visit (HOSPITAL_COMMUNITY)
Admission: RE | Admit: 2018-07-07 | Discharge: 2018-07-07 | Disposition: A | Discharge status: Home / Self Care | Payer: Self-pay | Source: Ambulatory Visit | Attending: Obstetrics & Gynecology | Admitting: Obstetrics & Gynecology

## 2018-07-07 ENCOUNTER — Ambulatory Visit (INDEPENDENT_AMBULATORY_CARE_PROVIDER_SITE_OTHER): Payer: Self-pay | Admitting: Obstetrics & Gynecology

## 2018-07-07 ENCOUNTER — Ambulatory Visit: Payer: Self-pay | Attending: Oncology | Admitting: *Deleted

## 2018-07-07 ENCOUNTER — Encounter (INDEPENDENT_AMBULATORY_CARE_PROVIDER_SITE_OTHER): Payer: Self-pay

## 2018-07-07 ENCOUNTER — Encounter: Payer: Self-pay | Admitting: Obstetrics & Gynecology

## 2018-07-07 VITALS — Ht <= 58 in | Wt 146.0 lb

## 2018-07-07 VITALS — BP 100/70 | Wt 146.0 lb

## 2018-07-07 DIAGNOSIS — R87612 Low grade squamous intraepithelial lesion on cytologic smear of cervix (LGSIL): Secondary | ICD-10-CM | POA: Insufficient documentation

## 2018-07-07 DIAGNOSIS — R87613 High grade squamous intraepithelial lesion on cytologic smear of cervix (HGSIL): Secondary | ICD-10-CM | POA: Insufficient documentation

## 2018-07-07 NOTE — Progress Notes (Signed)
Referring Provider:  [facility]  HPI:  Brittany Becker is a 27 y.o.  G1P0  who presents today for evaluation and management of abnormal cervical cytology.    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  OB History  Gravida Para Term Preterm AB Living  1            SAB TAB Ectopic Multiple Live Births               # Outcome Date GA Lbr Len/2nd Weight Sex Delivery Anes PTL Lv  1 Gravida             Past Medical History:  Diagnosis Date  . Depression   . Medical history non-contributory     Past Surgical History:  Procedure Laterality Date  . NO PAST SURGERIES      SOCIAL HISTORY: Social History   Substance and Sexual Activity  Alcohol Use No   Social History   Substance and Sexual Activity  Drug Use No     Family History  Problem Relation Age of Onset  . Mood Disorder Mother   . Heart disease Other     ALLERGIES:  Patient has no known allergies.  Current Outpatient Medications on File Prior to Visit  Medication Sig Dispense Refill  . metoCLOPramide (REGLAN) 5 MG tablet Take 1 tablet (5 mg total) by mouth every 8 (eight) hours as needed for nausea or vomiting. (Patient not taking: Reported on 08/22/2015) 15 tablet 0  . nitrofurantoin (MACRODANTIN) 100 MG capsule Take 100 mg by mouth 2 (two) times daily. For 7 days    . Prenatal Vit-Fe Fumarate-FA (PRENATAL MULTIVITAMIN) TABS tablet Take 1 tablet by mouth daily at 12 noon.     No current facility-administered medications on file prior to visit.     Physical Exam: -Vitals:  There were no vitals taken for this visit. GEN: WD, WN, NAD.  A+ O x 3, good mood and affect. ABD:  NT, ND.  Soft, no masses.  No hernias noted.   Pelvic:   Vulva: Normal appearance.  No lesions.  Vagina: No lesions or abnormalities noted.  Support: Normal pelvic support.  Urethra No masses tenderness or scarring.  Meatus Normal size without lesions or prolapse.  Cervix: See below.    Anus: Normal exam.  No lesions.  Perineum: Normal exam.  No lesions.        Bimanual   Uterus: Normal size.  Non-tender.  Mobile.  AV.  Adnexae: No masses.  Non-tender to palpation.  Cul-de-sac: Negative for abnormality.  Physical Exam  Genitourinary:      PROCEDURE: 1.  Urine Pregnancy Test:  not done 2.  Colposcopy performed with 4% acetic acid after verbal consent obtained                           -Aceto-white Lesions Location(s): all around .              -Biopsy performed at 6, 12 o'clock               -ECC indicated and performed: Yes.       -Biopsy sites made hemostatic with pressure, AgNO3, and/or Monsel's solution   -Satisfactory colposcopy: Yes.      -Evidence of Invasive cervical CA :  NO  ASSESSMENT:  Brittany Becker is a 27 y.o. G1P0 here for  1. LGSIL on  Pap smear of cervix   .  PLAN: 1.  I discussed the grading system of pap smears and HPV high risk viral types.  We will discuss and base management after colpo results return. 2. Follow up PAP 6 months, vs intervention if high grade dysplasia identified 3. Treatment of persistantly abnormal PAP smears and cervical dysplasia, even mild, is discussed w pt today in detail, as well as the pros and cons of Cryo and LEEP procedures. Will consider and discuss after results.      Barnett Applebaum, MD, [facility], [facility] 07/07/2018  1:27 PM

## 2018-07-07 NOTE — Patient Instructions (Signed)
Colposcopía - Cuidados posteriores  (Colposcopy, Care After)  Siga estas instrucciones durante las próximas semanas. Estas indicaciones le proporcionan información general acerca de cómo deberá cuidarse después del procedimiento. El médico también podrá darle instrucciones más específicas. El tratamiento se ha planificado de acuerdo a las prácticas médicas actuales, pero a veces se producen problemas. Comuníquese con el médico si tiene algún problema o tiene dudas después del procedimiento.  QUÉ ESPERAR DESPUÉS DEL PROCEDIMIENTO   Después del procedimiento, es típico tener las siguientes sensaciones:  · Cólicos. Generalmente se calman en algunos minutos.  · Dolor. Puede durar hasta dos días.  · Aturdimiento. Si esto le ocurre, recuéstese durante algunos minutos.  Podrá tener un sangrado leve o una secreción oscura que debe detenerse en algunos días. Durante este tiempo deberá usar un apósito sanitario.  INSTRUCCIONES PARA EL CUIDADO EN EL HOGAR  · Evite las relaciones sexuales, las duchas vaginales y el uso de tampones durante 3 días, o según lo que le indique su médico.  · Tome sólo medicamentos de venta libre o recetados, según las indicaciones del médico. No tome aspirina, ya que puede causar hemorragias.  · Si utiliza píldoras anticonceptivas, continúe tomándolas.  · No todos los resultados estarán disponibles durante su visita. En este caso, tenga otra entrevista con su médico para conocerlos. No suponga que es normal si no tiene noticias de su médico o del establecimiento de salud. Es importante el seguimiento de todos los resultados de los estudios.  · Siga los consejos de su médico con respecto a los medicamentos, actividades, visitas y [place] de control.  SOLICITE ATENCIÓN MÉDICA SI:  · Aparece una erupción cutánea.  · Tiene problemas con los medicamentos.  SOLICITE ATENCIÓN MÉDICA DE INMEDIATO SI:  · Tiene una hemorragia abundante o elimina coágulos.  · Tiene fiebre.  · Tiene flujo vaginal  anormal.  · Tiene cólicos que no se alivian luego de tomar analgésicos.  · Se siente mareada, tiene vahídos o se desmaya.  · Siente dolor en el estómago.  Esta información no tiene como fin reemplazar el consejo del médico. Asegúrese de hacerle al médico cualquier pregunta que tenga.  Document Released: 07/08/2013  Elsevier Interactive Patient Education © 2017 Elsevier Inc.

## 2018-07-07 NOTE — Progress Notes (Signed)
27 year old Hispanic patient referred to [place] by [facility] Department for financial assistance for further evaluation of recent abnormal pap of LGSIL.  Patient has been screened for [facility].  Patient has been screened for eligibility.  She does not have any insurance, Medicare or Medicaid.  She also meets financial eligibility.  Hand-out given on the Affordable Care Act.  Patient has an appointment today with Dr. Glennon Mac at [facility] for colposcopy and biopsy.  Reviewed abnormal pap via Leola Brazil, the interpreter.  Hand-out on "Understanding Pap Smears" given to patient.  Will follow-up per [facility] protocol.

## 2018-07-17 NOTE — Progress Notes (Signed)
Schedule cryo procedure for office- regular appt slot and can be in reg room not procedure room, but you will need to use interpreter or interpreter line [phone]) to help schedule.  I have already discussed this w pt.

## 2018-07-23 ENCOUNTER — Ambulatory Visit: Payer: Self-pay | Admitting: Obstetrics & Gynecology

## 2018-08-06 ENCOUNTER — Ambulatory Visit: Payer: Self-pay | Admitting: Obstetrics & Gynecology

## 2018-09-16 ENCOUNTER — Encounter: Payer: Self-pay | Admitting: *Deleted

## 2018-09-16 NOTE — Progress Notes (Signed)
Called patient via [place] on the [place].  Left message for patient to return my call.  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 Kenton Kingfisher.  I would like to offer to reschedule her appointment and see if she is eligible for [facility] Medicaid.

## 2018-10-16 ENCOUNTER — Telehealth: Payer: Self-pay | Admitting: Obstetrics & Gynecology

## 2018-10-16 NOTE — Telephone Encounter (Signed)
Patient was schedule twice for Cryo procedure. Patient cancelled appointment  on 07/23/18 and No showed schedule appointment on 08/06/18. Attempt to reach patient this afternoon to schedule appointment left voicemail for patient to call back to be schedule

## 2018-11-17 ENCOUNTER — Encounter: Payer: Self-pay | Admitting: *Deleted

## 2018-11-18 NOTE — Progress Notes (Signed)
Called patient to discuss rescheduling her appointment for her cryosurgery using the language line services.  States she does not have the money to have it done.  States she was told it would be $200.  States she is not a [place] citizen, so she will not qualify for [employer].  Discussed completing patient financial assistance forms.  She is to come by and pick up an application.  I will also contact Denyse Dago at [facility] to see if we could possible pay for the service out of our [employer].

## 2018-11-21 ENCOUNTER — Telehealth: Payer: Self-pay | Admitting: Obstetrics & Gynecology

## 2018-11-21 NOTE — Telephone Encounter (Signed)
Contacted patient through [facility], [place] [id number]. Patient is rescheduled for cryo on 12/02/18 @ 1:30pm w/ Dr. Kenton Kingfisher, and is aware that [facility] has funding.  Patient was extremely appreciative. Patient was given [facility]'s phone#.

## 2018-12-02 ENCOUNTER — Ambulatory Visit: Payer: Self-pay | Admitting: Obstetrics & Gynecology

## 2018-12-04 ENCOUNTER — Ambulatory Visit: Payer: Self-pay | Admitting: Obstetrics and Gynecology

## 2018-12-16 ENCOUNTER — Encounter: Payer: Self-pay | Admitting: *Deleted

## 2018-12-16 NOTE — Progress Notes (Signed)
Patient has cancelled the last 2 appointments with Dr. Kenton Kingfisher even after being notified we would pay for her cyro using a different funding source.  Brittany Becker to notify [facility] Department of patients noncompliance with treatment.  I will close her case as failure to follow up with treatment.  If she does decide to schedule another appointment with Dr. Kenton Kingfisher we can move forward with additional funding resources.

## 2019-01-02 ENCOUNTER — Telehealth: Payer: Self-pay | Admitting: Obstetrics & Gynecology

## 2019-01-02 NOTE — Telephone Encounter (Signed)
Brittany Becker, [place] [id number], patient is aware of rescheduled CRYO on 01/14/19 @ 1:50pm w/ Dr. Kenton Kingfisher. Patient is aware to call to check-in and remain in car.

## 2019-01-14 ENCOUNTER — Other Ambulatory Visit: Payer: Self-pay

## 2019-01-14 ENCOUNTER — Ambulatory Visit (INDEPENDENT_AMBULATORY_CARE_PROVIDER_SITE_OTHER): Payer: Self-pay | Admitting: Obstetrics & Gynecology

## 2019-01-14 ENCOUNTER — Encounter: Payer: Self-pay | Admitting: Obstetrics & Gynecology

## 2019-01-14 VITALS — BP 100/60 | Ht 60.0 in | Wt 154.0 lb

## 2019-01-14 DIAGNOSIS — N871 Moderate cervical dysplasia: Secondary | ICD-10-CM

## 2019-01-14 NOTE — Telephone Encounter (Signed)
[facility] billing flag was added, per Almyra Free.

## 2019-01-14 NOTE — Progress Notes (Signed)
   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  The indications for cryotherapy were reviewed with the patient in detail. She was counseled about that efficacy of this procedure, and possible need for excisional procedure in the future if her cervical dysplasia persists.  The risks of the procedure where explained in detail and patient was told to expect a copious amount of discharge in the next few weeks. All her questions were answered, and written informed consent was obtained.  The patient was placed in the dorsal lithotomy position and a vaginal speculum was placed. Her cervix was visualized and patient was noted to have had normal size transformation zone. The appropriate cryotherapy probes were picked and the more endocervical probe was then affixed to cryotherapy apparatus. Then, nitrogen gas was then activated, the probe was applied to the transformation zone of the cervix. This was kept in place for 2 minutes. The cryotherapy was then stopped and all instruments were removed from the patient's pelvis; a thawing period of 2 minutes was observed.  A second cycle of cryotherapy was then administered to the cervix with the more ectocervical probe for 2 minutes.  Then, the probe was removed.  The patient tolerated the procedure well without any complications. Routine post procedure instructions were given to the patient.  Will repeat pap smear in 3 months and manage accordingly.  Barnett Applebaum, MD, [facility], [facility] 01/14/2019  2:13 PM

## 2019-01-14 NOTE — Patient Instructions (Signed)
Crioablacin, cuidados posteriores  Cryoablation, Care After  Lea esta informacin sobre cmo cuidarse despus del procedimiento. Su mdico tambin podr darle indicaciones ms especficas. Comunquese con su mdico si tiene problemas o preguntas.  Qu puedo esperar despus del procedimiento?  Despus del procedimiento, es comn tener los siguientes sntomas:   Dolor alrededor de la zona tratada.   Dolor leve e hinchazn en la zona tratada.  Siga estas indicaciones en su casa:  Cuidado de la zona tratada     Siga las indicaciones del mdico acerca del cuidado de la incisin. Haga lo siguiente:  ? Lvese las manos con agua y jabn antes de cambiar las vendas (vendaje). Use desinfectante para manos si no dispone de agua y jabn.  ? Cambie el vendaje como se lo haya indicado el mdico.  ? No retire los puntos (suturas). Tal vez deban dejarse puestos en la piel durante 2semanas o ms tiempo.   Controle la zona tratada todos los das para detectar signos de infeccin. Est atento a los siguientes signos:  ? Aumento del enrojecimiento, de la hinchazn o del dolor.  ? Ms lquido o sangre.  ? Calor.  ? Pus o mal olor.   Mantenga la zona tratada seca, limpia y cubierta con un vendaje hasta que se cure. Lave la zona todos los das con agua y jabn como se lo haya indicado el mdico.   Puede ducharse si su mdico lo autoriza. Si el vendaje se moja, cmbielo inmediatamente.  Actividad   Siga las indicaciones del mdico respecto de las restricciones en las actividades.   No conduzca durante 24horas si le dieron un medicamento para ayudarla a que se relaje (sedante).  Instrucciones generales   Tome los medicamentos de venta libre y los recetados solamente como se lo haya indicado el mdico.   Concurra a todas las visitas de control como se lo haya indicado el mdico. Esto es importante.  Comunquese con un mdico si:   No defec durante 2das.   Tiene nuseas o vmitos.   Aumenta el enrojecimiento, la hinchazn  o el dolor alrededor de la zona tratada.   Aumenta la cantidad de lquido o sangre que sale de la zona tratada.   La zona tratada est caliente al tacto.   Tiene pus o percibe que sale mal olor de la zona tratada.   Tiene fiebre.  Solicite ayuda de inmediato si:   Siente dolor intenso.   Presenta dificultades respiratorias o problemas para tragar.   Tiene debilidad o mareos fuertes.   Siente falta de aire o dolor en el pecho.  Esta informacin no tiene como fin reemplazar el consejo del mdico. Asegrese de hacerle al mdico cualquier pregunta que tenga.  Document Released: 07/08/2013 Document Revised: 12/20/2016 Document Reviewed: 02/15/2016  Elsevier Interactive Patient Education  2019 Elsevier Inc.

## 2019-01-16 LAB — PAP IG (IMAGE GUIDED)

## 2019-04-16 ENCOUNTER — Ambulatory Visit: Payer: Self-pay | Admitting: Obstetrics & Gynecology

## 2019-04-21 ENCOUNTER — Ambulatory Visit (INDEPENDENT_AMBULATORY_CARE_PROVIDER_SITE_OTHER): Payer: Self-pay | Admitting: Obstetrics & Gynecology

## 2019-04-21 ENCOUNTER — Encounter: Payer: Self-pay | Admitting: Obstetrics & Gynecology

## 2019-04-21 ENCOUNTER — Other Ambulatory Visit: Payer: Self-pay

## 2019-04-21 ENCOUNTER — Other Ambulatory Visit (HOSPITAL_COMMUNITY)
Admission: RE | Admit: 2019-04-21 | Discharge: 2019-04-21 | Disposition: A | Discharge status: Home / Self Care | Payer: Self-pay | Source: Ambulatory Visit | Attending: Obstetrics & Gynecology | Admitting: Obstetrics & Gynecology

## 2019-04-21 VITALS — BP 120/80 | Wt 151.0 lb

## 2019-04-21 DIAGNOSIS — N871 Moderate cervical dysplasia: Secondary | ICD-10-CM | POA: Insufficient documentation

## 2019-04-21 NOTE — Progress Notes (Signed)
  HPI:  Patient is a 28 y.o. G1P0 presenting for follow up evaluation of abnormal PAP smear in the past.   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 Most recent PAP 01/14/19 revealed ASCUS; prior PAP 06/19/18 was low-grade squamous intraepithelial neoplasia (LGSIL - encompassing HPV,mild dysplasia,CIN I). Pt had CRYOTHERAPY to cervix in 12/2018  PMHx: She  has a past medical history of Depression and Medical history non-contributory. Also,  has a past surgical history that includes No past surgeries., family history includes Heart disease in an other family member; Mood Disorder in her mother.,  reports that she has never smoked. She has never used smokeless tobacco. She reports that she does not drink alcohol or use drugs.  She has a current medication list which includes the following prescription(s): metoclopramide, nitrofurantoin, and prenatal multivitamin. Also, is allergic to nitrofurantoin.  Review of Systems  All other systems reviewed and are negative.   Objective: BP 120/80   Wt 151 lb (68.5 kg)   LMP 04/07/2019   BMI 29.49 kg/m  Filed Weights   04/21/19 1444  Weight: 151 lb (68.5 kg)   Body mass index is 29.49 kg/m.  Physical examination Physical Exam Constitutional:      General: She is not in acute distress.    Appearance: She is well-developed.  Genitourinary:     Pelvic exam was performed with patient supine.     Vagina and uterus normal.     No vaginal erythema or bleeding.     No cervical motion tenderness, discharge, polyp or nabothian cyst.     Uterus is mobile.     Uterus is not enlarged.     No uterine mass detected.    Uterus is midaxial.     No right or left adnexal mass present.     Right adnexa not tender.     Left adnexa not tender.  HENT:     Head: Normocephalic and atraumatic.     Nose: Nose normal.  Abdominal:     General: There is no distension.     Palpations: Abdomen is soft.     Tenderness: There is no  abdominal tenderness.  Musculoskeletal: Normal range of motion.  Neurological:     Mental Status: She is alert and oriented to person, place, and time.     Cranial Nerves: No cranial nerve deficit.  Skin:    General: Skin is warm and dry.     ASSESSMENT:  History of Cervical Dysplasia   ICD-10-CM   1. 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  I discussed the grading system of pap smears and HPV high risk viral types.   2. Follow up PAP 6 months, vs intervention if high grade dysplasia identified.  Barnett Applebaum, MD, [facility], [facility] 04/21/2019  3:05 PM

## 2019-04-24 LAB — CYTOLOGY - PAP: Diagnosis: NEGATIVE

## 2019-06-11 ENCOUNTER — Encounter: Payer: Self-pay | Admitting: *Deleted

## 2019-06-11 NOTE — Progress Notes (Signed)
Per office notes, it looks like the patient did return for her cryotherapy and has had a post treatment normal pap.  For next recommended pap patient will need to renew with [facility] or return to the health department.  Will cc note to her providers.

## 2020-07-06 HISTORY — PX: CHOLECYSTECTOMY, LAPAROSCOPIC: SHX56

## 2020-07-12 DIAGNOSIS — Z9149 Other personal history of psychological trauma, not elsewhere classified: Secondary | ICD-10-CM

## 2020-07-13 ENCOUNTER — Encounter: Payer: Self-pay | Admitting: Family Medicine

## 2020-07-13 ENCOUNTER — Ambulatory Visit: Payer: Self-pay

## 2020-07-13 ENCOUNTER — Ambulatory Visit (LOCAL_COMMUNITY_HEALTH_CENTER): Payer: Self-pay | Admitting: Family Medicine

## 2020-07-13 ENCOUNTER — Other Ambulatory Visit: Payer: Self-pay

## 2020-07-13 VITALS — BP 98/68 | Ht 59.0 in | Wt 156.0 lb

## 2020-07-13 DIAGNOSIS — Z30017 Encounter for initial prescription of implantable subdermal contraceptive: Secondary | ICD-10-CM

## 2020-07-13 DIAGNOSIS — Z3009 Encounter for other general counseling and advice on contraception: Secondary | ICD-10-CM

## 2020-07-13 LAB — WET PREP FOR TRICH, YEAST, CLUE
Trichomonas Exam: NEGATIVE
Yeast Exam: NEGATIVE

## 2020-07-13 MED ORDER — ETONOGESTREL 68 MG ~~LOC~~ IMPL
68.0000 mg | DRUG_IMPLANT | Freq: Once | SUBCUTANEOUS | Status: AC
  Administered 2020-07-13: 68 mg via SUBCUTANEOUS

## 2020-07-13 NOTE — Progress Notes (Signed)
[employer] results reviewed. Per standing orders no treatment indicated. Hal Morales, RN

## 2020-07-13 NOTE — Progress Notes (Signed)
Here today for a PE and birth control. Last PE here was 06/2018. Last Pap Smear was 04/21/2019. Wants all STD screening today. Interested in [place] for birth control. Nexplanon Consult completed. Hal Morales, RN
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
## 2020-07-15 LAB — IGP, RFX APTIMA HPV ASCU: PAP Smear Comment: 0

## 2020-10-03 ENCOUNTER — Other Ambulatory Visit: Payer: Self-pay

## 2020-10-03 ENCOUNTER — Ambulatory Visit (LOCAL_COMMUNITY_HEALTH_CENTER): Payer: Self-pay

## 2020-10-03 VITALS — BP 104/70 | Ht 59.0 in | Wt 165.0 lb

## 2020-10-03 DIAGNOSIS — Z3202 Encounter for pregnancy test, result negative: Secondary | ICD-10-CM

## 2020-10-03 LAB — PREGNANCY, URINE: Preg Test, Ur: NEGATIVE

## 2020-10-03 NOTE — Progress Notes (Signed)
Client with [facility] Nexplanon insertion 07/13/2020. Client presents for UPT today as no menses since Nexplanon inserted. Client counseled that no menses quite common with Nexplanon.and for now desires to continue with Nexplanon. Jossie Ng, RN

## 2021-06-28 ENCOUNTER — Other Ambulatory Visit: Payer: Self-pay

## 2021-06-28 ENCOUNTER — Ambulatory Visit (LOCAL_COMMUNITY_HEALTH_CENTER): Payer: Self-pay | Admitting: Family Medicine

## 2021-06-28 VITALS — BP 111/69 | Ht 59.0 in | Wt 174.2 lb

## 2021-06-28 DIAGNOSIS — R102 Pelvic and perineal pain: Secondary | ICD-10-CM

## 2021-07-02 NOTE — Progress Notes (Signed)
S: Pt in clinic for nexplanon removal, reports that nexplanon has been causing no periods and has had intermittent cramping at bilateral lower abdomen near ovaries since she has had nexplanon.  Pt denies presenting this problem with a provider before now.    O: nexplanon inserted 07/13/2020  A: 1. Complaint of pelvic pain  P: Discussed with patient about how nexplanon works and that it can   cause light to no periods.  This is normal for nexplanon.   -Also discussed  that pain in abdomen needs to be further evaluiated to rule out other problems such as PCOS or endometrosis, etc.   -Patient is ok with referral for further evaluation of abdominal pain.   - will keep nexplanon until after referral visit.    Referral will be sent [facility] clinic for patient.     [place] used for Spanish interpretation.     Junious Dresser, FNP'

## 2021-07-06 NOTE — Progress Notes (Signed)
PAP Letter mailed today.  PAP due 07-2021. Dahlia Bailiff, RN

## 2021-07-14 ENCOUNTER — Encounter: Payer: Self-pay | Admitting: Family Medicine

## 2021-07-14 ENCOUNTER — Other Ambulatory Visit: Payer: Self-pay

## 2021-07-14 ENCOUNTER — Ambulatory Visit (LOCAL_COMMUNITY_HEALTH_CENTER): Payer: Self-pay | Admitting: Family Medicine

## 2021-07-14 VITALS — BP 103/85 | HR 85 | Temp 97.7°F | Resp 16 | Ht 59.0 in | Wt 176.0 lb

## 2021-07-14 DIAGNOSIS — Z1272 Encounter for screening for malignant neoplasm of vagina: Secondary | ICD-10-CM

## 2021-07-14 DIAGNOSIS — Z3009 Encounter for other general counseling and advice on contraception: Secondary | ICD-10-CM

## 2021-07-14 DIAGNOSIS — Z01419 Encounter for gynecological examination (general) (routine) without abnormal findings: Secondary | ICD-10-CM

## 2021-07-14 DIAGNOSIS — Z113 Encounter for screening for infections with a predominantly sexual mode of transmission: Secondary | ICD-10-CM

## 2021-07-19 LAB — IGP, RFX APTIMA HPV ASCU: PAP Smear Comment: 0
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
## 2021-07-22 LAB — SPECIMEN STATUS REPORT

## 2021-07-22 LAB — HPV APTIMA: HPV Aptima: NEGATIVE

## 2021-08-30 ENCOUNTER — Telehealth: Payer: Self-pay | Admitting: Family Medicine

## 2021-08-30 NOTE — Telephone Encounter (Signed)
Was told she would get results in mail or a phone call.  But she has not receive anything.

## 2022-02-05 ENCOUNTER — Encounter: Payer: Self-pay | Admitting: Nurse Practitioner

## 2022-02-05 ENCOUNTER — Ambulatory Visit (LOCAL_COMMUNITY_HEALTH_CENTER): Payer: Self-pay | Admitting: Nurse Practitioner

## 2022-02-05 VITALS — BP 106/67 | Ht 59.0 in | Wt 184.0 lb

## 2022-02-05 DIAGNOSIS — Z3046 Encounter for surveillance of implantable subdermal contraceptive: Secondary | ICD-10-CM

## 2022-02-05 DIAGNOSIS — Z3009 Encounter for other general counseling and advice on contraception: Secondary | ICD-10-CM

## 2022-02-05 NOTE — Progress Notes (Signed)
Patient seen for Nexplanon removal. ?

## 2022-02-05 NOTE — Progress Notes (Signed)
Nexplanon Removal ?Patient identified, informed consent performed, consent signed.   Appropriate time out taken. Nexplanon site identified.  Area prepped in usual sterile fashon. 3 ml of 1% lidocaine with Epinephrine was used to anesthetize the area at the distal end of the implant and along implant site. A small stab incision was made right beside the implant on the distal portion.  The Nexplanon rod was grasped using hemostats/manual and removed without difficulty.  There was minimal blood loss. There were no complications.  Steri-strips were applied over the small incision.  A pressure bandage was applied to reduce any bruising.  The patient tolerated the procedure well and was given post procedure instructions.   Gregary Cromer, FNP  ?

## 2022-05-03 ENCOUNTER — Telehealth: Payer: Self-pay

## 2022-05-03 NOTE — Telephone Encounter (Signed)
TC to patient to cancel her new OB appointment on 05/07/2022. Patient started care at [facility] on 04/23/2022 and had a 3 hour gtt at 9 weeks pregnancy. All values were elevated, provider E. Sciora, CNM, aware and patient is too high risk to be seen here for maternity care. Patient counseled that it would be best for her to continue care with [facility] and she states she has several more appointments at [facility]. Per [facility], she has follow-up appointments on 05/07/2022 and 05/14/2022 with [facility]. Patient states understanding and denies questions at this time. [address] [place], [place]#[id number].Marland KitchenJenetta Downer, RN

## 2023-02-07 ENCOUNTER — Ambulatory Visit (LOCAL_COMMUNITY_HEALTH_CENTER): Payer: Self-pay | Admitting: Advanced Practice Midwife

## 2023-02-07 ENCOUNTER — Encounter: Payer: Self-pay | Admitting: Advanced Practice Midwife

## 2023-02-07 VITALS — BP 98/61 | HR 63 | Wt 187.0 lb

## 2023-02-07 DIAGNOSIS — Z3009 Encounter for other general counseling and advice on contraception: Secondary | ICD-10-CM

## 2023-02-07 DIAGNOSIS — Z3042 Encounter for surveillance of injectable contraceptive: Secondary | ICD-10-CM

## 2023-02-07 LAB — WET PREP FOR TRICH, YEAST, CLUE
Trichomonas Exam: NEGATIVE
Yeast Exam: NEGATIVE

## 2023-02-07 MED ORDER — MEDROXYPROGESTERONE ACETATE 150 MG/ML IM SUSP
150.0000 mg | INTRAMUSCULAR | Status: AC
  Administered 2023-02-07 – 2023-04-26 (×2): 150 mg via INTRAMUSCULAR

## 2023-02-07 NOTE — Progress Notes (Signed)
[facility] [address] Main Number: [phone]  Contraception/Family Planning VISIT ENCOUNTER NOTE  Subjective:   Brittany Becker is a 32 y.o. SHF nonsmoker G2P1102 (7, 3 months) female here for reproductive life counseling. The patient is currently using Hormonal Injection to prevent pregnancy.    The patient does not want a pregnancy in the next year.  Pt is breastfeeding and formula feeding her infant. Not working and living with her kids and her sister. Wants DMPA. Last PE 11/21/22 pp at [facility]. Last pap 07/14/21 neg. 11/03/15 pap LGSIL with HPV effect. Last sex 01/22/23 without condom; with current partner x 2 years; 1 partner in last 3 mo. Denies cigs, vaping, cigars, MJ, ETOH. LMP not since pp. Last DMPA 10/21/22  Client states they are looking for the following:  High efficacy at preventing pregnancy  Denies abnormal vaginal bleeding, discharge, pelvic pain, problems with intercourse or other gynecologic concerns.    Gynecologic History No LMP recorded. Patient has had an injection.  Health Maintenance Due  Topic Date Due   COVID-19 Vaccine (1) Never done   Hepatitis C Screening  Never done   PAP SMEAR-Modifier  07/14/2022     The following portions of the patient's history were reviewed and updated as appropriate: allergies, current medications, past family history, past medical history, past social history, past surgical history and problem list.  Review of Systems Pertinent items are noted in HPI.   Objective:  BP 98/61 (BP Location: Right Arm, Patient Position: Sitting, Cuff Size: Normal)   Pulse 63   Wt 187 lb (84.8 kg)   BMI 37.77 kg/m  Gen: well appearing, NAD HEENT: no scleral icterus CV: RR Lung: Normal WOB Ext: warm well perfused  PELVIC: Normal appearing external genitalia; normal appearing vaginal mucosa and cervix.  No abnormal discharge noted.  Pap smear obtained.  Normal uterine size,  no other palpable masses, no uterine or adnexal tenderness.   Assessment and Plan:   Need for emergency contraceptive care was assessed today.  Last unprotected sex was:  01/22/23 without condom; with current partner x 2 years; 1 partner in last 3 mo  Patient reported not meeting criteria.  Reviewed options and patient desired No method of ECP, declined all    Contraception counseling: Reviewed methods in a patient centered fashion and used shared decision making with the patient. Utilized [employer] patient education tools as appropriate. The patient stated there goals and desires from a method are: High efficacy at preventing pregnancy  We reviewed the following methods in detail based on patient preferences available included: Hormonal Injection  Patient expressed they would like Hormonal Injection  This was provided to the patient today.  if not why not clearly documented  Risks, benefits, and typical effectiveness rates were reviewed.  Questions were answered.  Written information was also given to the patient to review.       will follow up in  11-13 weeks for surveillance.   was told to call with any further questions, or with any concerns about this method of contraception or cycle control.  Emphasized use of condoms 100% of the time for STI prevention.   1. Family planning May have DMPA 150 mg IM q 11-13 wks until 11/2023 Needs physical 11/2023  - WET PREP FOR [place]ICH, YEAST, CLUE - Chlamydia/Gonorrhea [place] [facility] - IGP, Aptima HPV  2. Encounter for surveillance of injectable contraceptive See above    Please refer to After  Visit Summary for other counseling recommendations.   No follow-ups on file.  Alberteen Spindle, CNM [facility] DEPARTMENT

## 2023-02-07 NOTE — Progress Notes (Signed)
Pt appointment for Pap and Depo. Seen by Lesia Sago. Depo administered per order, and follow up appointment card provided. Wet prep all negative and reviewed with patient. Family planning packet given and contents reviewed.

## 2023-02-14 LAB — IGP, APTIMA HPV
HPV Aptima: NEGATIVE
PAP Smear Comment: 0

## 2023-04-26 ENCOUNTER — Ambulatory Visit (LOCAL_COMMUNITY_HEALTH_CENTER): Payer: Self-pay

## 2023-04-26 VITALS — BP 100/70 | Ht <= 58 in | Wt 183.5 lb

## 2023-04-26 DIAGNOSIS — Z3042 Encounter for surveillance of injectable contraceptive: Secondary | ICD-10-CM

## 2023-04-26 DIAGNOSIS — Z3009 Encounter for other general counseling and advice on contraception: Secondary | ICD-10-CM

## 2023-04-26 NOTE — Progress Notes (Signed)
11 weeks 1 day post depo.  Denies problems.  See flowsheet.   Depo administered IM left deltoid as ordered by E. Sciora CNM dated 02/07/23.  Tolerated injection well.  Next depo due 07/12/23; appt reminder given.  Rachel Bo Teixeria interpreted.  Cherlynn Polo, RN

## 2023-07-25 ENCOUNTER — Ambulatory Visit: Payer: Self-pay

## 2024-01-28 ENCOUNTER — Ambulatory Visit: Payer: Self-pay

## 2024-01-28 ENCOUNTER — Ambulatory Visit: Payer: Self-pay | Admitting: Nurse Practitioner

## 2024-01-28 ENCOUNTER — Encounter: Payer: Self-pay | Admitting: Nurse Practitioner

## 2024-01-28 DIAGNOSIS — Z01419 Encounter for gynecological examination (general) (routine) without abnormal findings: Secondary | ICD-10-CM

## 2024-01-28 DIAGNOSIS — Z3009 Encounter for other general counseling and advice on contraception: Secondary | ICD-10-CM

## 2024-01-28 LAB — WET PREP FOR TRICH, YEAST, CLUE
Trichomonas Exam: NEGATIVE
Yeast Exam: NEGATIVE

## 2024-01-28 LAB — HM HIV SCREENING LAB: HM HIV Screening: NEGATIVE

## 2024-01-28 NOTE — Progress Notes (Signed)
 Patient is here for PE visit. Wet prep results reviewed with pt, no treatment required per standing order. Condoms declined. Austine Lefort, RN.

## 2024-02-04 ENCOUNTER — Encounter: Payer: Self-pay | Admitting: Nurse Practitioner

## 2024-02-04 NOTE — Progress Notes (Signed)
 [employer] HE[facility] DEPARTMENT [facility] [address], [address] [place] [address] Main phone: [phone]  Family Planning Visit - Repeat Yearly Visit  Subjective:  Brittany Becker is a 33 y.o. [id number]  being seen today for an annual wellness visit and to discuss contraception options. The patient is currently using abstinence for pregnancy prevention. Patient does not want a pregnancy in the next year.   Patient reports they are looking for a method with the following characteristics:  Not interested in contraception at this time.   Patient has the following medical problems:  Patient Active Problem List   Diagnosis Date Noted  . 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 LGSIL on Pap smear of cervix 07/07/2018  . Low grade squamous intraepithelial lesion on cytologic smear of cervix (LGSIL) 11/03/15 11/21/2015  . History of psychological trauma 06/28/2015    No chief complaint on file.   HPI Patient is a pleasant 33 y.o. female who presents to the office today for annual well woman exam, and is requesting symptomatic STI testing.   Patient reports concerns today include the following: Weight loss of 20 pounds since February 2024. Reports she is not trying to lose weight. Of note, patient was PP in February of 2024 when she began losing weight. Vaginal Discharge reported as increased amount, white to yellowish color, and thick. Patient denies odor and genital itching.   Patient indicates 1 female partner in the last 12 months. She reports practicing vaginal sex when sexually active but last sex was almost 1 year ago (02/07/24). Patient indicates no STI history. She indicates prior use of hormonal injection as contraception method but last injection was 04/26/2023.[name]  Patient indicates LMP was 01/14/24 and has periods monthly.     Review of Systems  Constitutional:  Positive for weight loss.  HENT:  Negative for sore throat.    Eyes:  Negative for blurred vision.  Respiratory:  Negative for cough, shortness of breath and wheezing.   Cardiovascular:  Negative for chest pain and claudication.  Gastrointestinal:  Negative for nausea and vomiting.  Genitourinary:  Negative for dysuria and frequency.       Positive for vaginal discharge.  Skin:  Negative for rash.  Neurological:  Negative for dizziness, seizures and headaches.  Endo/Heme/Allergies:  Does not bruise/bleed easily.    See flowsheet for other program required questions.   Diabetes screening This patient is 33 y.o. with a BMI of There is no height or weight on file to calculate BMI..  Is patient eligible for diabetes screening (age >35 and BMI >25)?  no  Was Hgb A1c ordered? not applicable  STI screening Patient reports 1 of partners in last year.  Does this patient desire STI screening?  Yes  Hepatitis C screening Has patient been screened once for HCV in the past?  No  No results found for: "HCVAB"  Does the patient meet criteria for HCV testing? No  (If yes-- Screen for HCV through [facility]) Criteria:  Since the last HCV result, does the patient have any of the following? - Current drug use - Have a partner with drug use - Has been incarcerated  Hepatitis B screening Does the patient meet criteria for HBV testing? No Criteria:  -Household, sexual or needle sharing contact with HBV -History of drug use -HIV positive -Those with known Hep C  Cervical Cancer Screening  Result Date Procedure Results Follow-ups  02/07/2023 IGP, Aptima HPV DIAGNOSIS::  Comment Specimen adequacy:: Comment Clinician Provided ICD10: Comment Performed by:: Comment PAP Smear Comment: . Note:: Comment Test Methodology: Comment HPV Aptima: Negative   07/14/2021 IGP, rfx Aptima HPV ASCU DIAGNOSIS:: Comment Specimen adequacy:: Comment Clinician Provided ICD10: Comment Performed by:: Comment PAP Smear Comment: . Note:: Comment Test Methodology:  Comment PAP Reflex: Comment   07/13/2020 IGP, rfx Aptima HPV ASCU DIAGNOSIS:: Comment Specimen adequacy:: Comment Clinician Provided ICD10: Comment Performed by:: Comment QC reviewed by:: Comment PAP Smear Comment: . Note:: Comment Test Methodology: Comment PAP Reflex: Comment   04/21/2019 Cytology - PAP Adequacy: Satisfactory for evaluation  endocervical/transformation zone component PRESENT. Diagnosis: NEGATIVE FOR INTRAEPITHELIAL LESIONS OR MALIGNANCY. BENIGN REACTIVE/REPARATIVE CHANGES. Material Submitted: CervicoVaginal Pap [ThinPrep Imaged] CYTOLOGY - PAP: PAP RESULT   01/14/2019 Pap IG (Image Guided) Interpretation: EPCA,ASUSB (A) Test Methodology: Comment Category: ASC-US  (A) Recommendation: SFC (A) Adequacy: ENDO,PCOIB Clinician Provided ICD10: Comment Performed by:: Comment Electronically signed by:: Comment PATHOLOGIST PROVIDED ICD10:: Comment Note:: Comment   07/07/2018 Surgical pathology    06/17/2018 HM PAP SMEAR HM Pap smear: LSIL   06/09/2018 Pap IG (Image Guided)      Health Maintenance Due  Topic Date Due  . Hepatitis C Screening  Never done  . COVID-19 Vaccine (1 - 2024-25 season) Never done  . Cervical Cancer Screening (Pap smear)  02/07/2024    The following portions of the patient's history were reviewed and updated as appropriate: allergies, current medications, past family history, past medical history, past social history, past surgical history and problem list. Problem list updated.  Objective:  There were no vitals filed for this visit.  Physical Exam Vitals and nursing note reviewed. Chaperone present: Declined.  Constitutional:      Appearance: Normal appearance.  HENT:     Head: Normocephalic.     Salivary Glands: Right salivary gland is not diffusely enlarged or tender. Left salivary gland is not diffusely enlarged or tender.     Mouth/Throat:     Lips: Pink. No lesions.     Mouth: Mucous membranes are moist.     Tongue: No lesions. Tongue  does not deviate from midline.     Pharynx: Oropharynx is clear. Uvula midline. No oropharyngeal exudate or posterior oropharyngeal erythema.     Tonsils: No tonsillar exudate.  Eyes:     General:        Right eye: No discharge.        Left eye: No discharge.  Neck:     Thyroid: No thyroid mass or thyroid tenderness.     Trachea: Trachea and phonation normal. No tracheal tenderness or tracheal deviation.  Cardiovascular:     Rate and Rhythm: Normal rate and regular rhythm.     Heart sounds: Normal heart sounds, S1 normal and S2 normal.  Pulmonary:     Effort: Pulmonary effort is normal.     Breath sounds: Normal breath sounds and air entry.  Chest:  Breasts:    Tanner Score is 5.     Breasts are symmetrical.     Right: Normal.     Left: Normal.  Abdominal:     General: Abdomen is flat. Bowel sounds are normal.     Palpations: Abdomen is soft.     Tenderness: There is no abdominal tenderness. There is no guarding or rebound.  Genitourinary:    General: Normal vulva.     Exam position: Lithotomy position.     Pubic Area: No rash or pubic lice.      Tanner stage (  genital): 5.     Labia:        Right: No rash, tenderness, lesion or injury.        Left: No rash, tenderness, lesion or injury.      Vagina: Normal. No signs of injury and foreign body. No vaginal discharge, erythema, tenderness, bleeding or lesions.     Cervix: Normal. No cervical motion tenderness, discharge, friability, lesion, erythema, cervical bleeding or eversion.     Uterus: Normal.      Adnexa: Right adnexa normal and left adnexa normal.     Comments: pH<4.5 No abnormal discharge present on PE.   Lymphadenopathy:     Head:     Right side of head: No submental, submandibular, tonsillar, preauricular or posterior auricular adenopathy.     Left side of head: No submental, submandibular, tonsillar, preauricular or posterior auricular adenopathy.     Cervical: No cervical adenopathy.     Right cervical: No  superficial or posterior cervical adenopathy.    Left cervical: No superficial or posterior cervical adenopathy.     Upper Body:     Right upper body: No supraclavicular or axillary adenopathy.     Left upper body: No supraclavicular or axillary adenopathy.     Lower Body: No right inguinal adenopathy. No left inguinal adenopathy.  Skin:    General: Skin is warm and dry.     Findings: No lesion or rash.     Comments: Skin tone appropriate for ethnicity.   Neurological:     Mental Status: She is alert and oriented to person, place, and time.  Psychiatric:        Attention and Perception: Attention and perception normal.        Mood and Affect: Mood and affect normal.        Speech: Speech normal.        Behavior: Behavior normal. Behavior is cooperative.        Thought Content: Thought content normal.   Assessment and Plan:  Amrie Hunstad is a 33 y.o. female G2P1102 presenting to the [facility] Department for an yearly wellness and contraception visit  1. Family planning (Primary) Contraception counseling:  Reviewed options based on patient desire and reproductive life plan. Patient is interested in Abstinence.  Risks, benefits, and typical effectiveness rates were reviewed.  Questions were answered.  Written information was also given to the patient to review.    The patient will follow up in  1 years for surveillance.  The patient was told to call with any further questions, or with any concerns about this method of contraception.  Emphasized use of condoms 100% of the time for STI prevention.  Emergency Contraception Precautions (ECP): Patient assessed for need of ECP. She is not a candidate based on no intercourse since LMP.   2. Well woman exam Reviewing patient's recorded weights over the last year, she has lost a reasonable amount in relation to the time (12lbs in 9 months). Also given patient was PP, has come off of Depo,, and has no other  signs/symtptoms, the weight loss is reasonable and not concerning.  CBE completed today. Can be completed in 1-3 years or sooner if concerns arise according to ACOG guidelines. Mammogram should begin at age 4 unless concerns arise.  PAP due 01/2028. History of abnormal pap, but in distant past with 4 NILM most recently. Next PAP should include HPV co-testing.   3. Screening for venereal disease Wet prep negative in office today.  -  Chlamydia/Gonorrhea [facility] - HIV [place] [facility] - WET PREP FOR TRICH, YEAST, CLUE - Syphilis Serology, [place] [facility]  Return in about 1 year (around 01/27/2025) for annual well-woman exam.  No future appointments.  Due to language barrier, a Spanish interpreter Elia Groom, [place] #[id number] ) was present by phone during the history-taking, subsequent discussion, and physical exam with this patient.   Merleen Stare, NP

## 2024-02-10 ENCOUNTER — Encounter: Payer: Self-pay | Admitting: Nurse Practitioner

## 2024-03-06 NOTE — Progress Notes (Signed)
 Erroneous Encounter-Disregard.  Patient cancelled appointment. No charge.  Leary Provencal L. Floye Fesler, FNP-C

## 2024-09-07 ENCOUNTER — Encounter: Payer: Self-pay | Admitting: Family Medicine

## 2024-09-07 ENCOUNTER — Ambulatory Visit (LOCAL_COMMUNITY_HEALTH_CENTER): Payer: Self-pay | Admitting: Family Medicine

## 2024-09-07 VITALS — BP 92/63 | HR 72 | Ht <= 58 in | Wt 180.4 lb

## 2024-09-07 DIAGNOSIS — Z3009 Encounter for other general counseling and advice on contraception: Secondary | ICD-10-CM

## 2024-09-07 DIAGNOSIS — N750 Cyst of Bartholin's gland: Secondary | ICD-10-CM

## 2024-09-07 DIAGNOSIS — Z113 Encounter for screening for infections with a predominantly sexual mode of transmission: Secondary | ICD-10-CM

## 2024-09-07 LAB — WET PREP FOR TRICH, YEAST, CLUE
Clue Cell Exam: NEGATIVE
Trichomonas Exam: NEGATIVE
Yeast Exam: NEGATIVE

## 2024-09-07 NOTE — Progress Notes (Addendum)
 [employer] [facility] DEPARTMENT [facility] [address], [address] [place] [address] Main phone: [phone]  Women's Health Problem Visit   Subjective:  Brittany Becker is a 33 y.o. being seen today for Acute Family Planning visit for Bartholin's gland cysts discomfort.   Chief Complaint  Patient presents with   Acute Visit    HPI Pt reports 4 months ago had pain that she thought it was a pimple that had blood coming out of it.. Pt reports day by day pain got worse and pimple got bigger.  She reports it's difficult to sit down and she feels pain because she is putting pressure on it.  She went to the hospital because she couldn't handle it. She went to the hospital to have the lump checked checked because it was fluid/discharge coming from it.  She is concerned for infection and desires comprehensive STI testing today as well. She also has a pimple on top of her vaginal area that she noticed 3 days ago after having sex with partner, the pimple is not painful to the touch and she doesn't notice any pus coming from the pmple.   Does the patient have a current or past history of drug use? No   No components found for: HCV]  Health Maintenance Due  Topic Date Due   Hepatitis C Screening  Never done   Hepatitis B Vaccines 19-59 Average Risk (1 of 3 - 19+ 3-dose series) Never done   HPV VACCINES (1 - 3-dose SCDM series) Never done   Cervical Cancer Screening (Pap smear)  02/07/2024   Influenza Vaccine  05/01/2024   COVID-19 Vaccine (1 - 2025-26 season) Never done    Review of Systems  Constitutional: Negative.   HENT: Negative.    Eyes: Negative.   Respiratory: Negative.    Cardiovascular: Negative.   Gastrointestinal: Negative.   Genitourinary: Negative.   Musculoskeletal: Negative.   Skin: Negative.   Neurological: Negative.   Psychiatric/Behavioral: Negative.      The following portions of the patient's history were reviewed  and updated as appropriate: allergies, current medications, past family history, past medical history, past social history, past surgical history and problem list. Problem list updated.  See flowsheet for other program required questions.  Objective:   Vitals:   09/07/24 1422  BP: 92/63  Pulse: 72  Weight: 180 lb 6.4 oz (81.8 kg)  Height: 4' 9 (1.448 m)    Physical Exam Vitals and nursing note reviewed. Exam conducted with a chaperone present Kayleen Helling, MD).  Constitutional:      Appearance: Normal appearance. She is normal weight.  HENT:     Head: Normocephalic.     Nose: Nose normal.     Mouth/Throat:     Mouth: Mucous membranes are moist.     Pharynx: Oropharynx is clear.  Eyes:     Conjunctiva/sclera: Conjunctivae normal.     Pupils: Pupils are equal, round, and reactive to light.  Cardiovascular:     Pulses: Normal pulses.  Abdominal:     General: Abdomen is flat.  Genitourinary:    Exam position: Lithotomy position.     Labia:        Left: No tenderness.      Vagina: Tenderness present. No vaginal discharge or erythema.     Comments: Left labia swollen. Bartholin Cyst Gland noted internally at 5 o'clock.  Musculoskeletal:        General: Normal range of motion.  Cervical back: Normal range of motion.  Skin:    General: Skin is warm.     Capillary Refill: Capillary refill takes less than 2 seconds.  Neurological:     General: No focal deficit present.     Mental Status: She is alert and oriented to person, place, and time. Mental status is at baseline.  Psychiatric:        Mood and Affect: Mood normal.        Behavior: Behavior normal.        Thought Content: Thought content normal.     Assessment and Plan:  Brittany Becker is a 33 y.o. female presenting to the [facility] Department for a Women's Health problem visit   1. Screening for venereal disease (Primary) -Routine STI tesing conducted. Pt unable to stay for  bloodwork today will need to return for bloodwork this week.  Wet prep and GC/CC collected during visit.  - Syphilis Serology, [place] [facility] - HIV [place] [facility] - Chlamydia/Gonorrhea [facility] - WET PREP FOR TRICH, YEAST, CLUE  2. Bartholin's gland cyst - Recommended NSAIDS, warm compresses and vaginal lidocaine for discomfort. Provided sample of vaginal lidocaine and explained usage to provide comfort.  - Referral placed to [facility]  at [facility] for further evaluation and treatment for bartholin's gland cysts.  Due to language barrier, a Spanish interpreter Emmie SAILOR.) was present in person during the history-taking, subsequent discussion, and physical exam with this patient.    Return STI bloodwork this week..  Future Appointments  Date Time Provider Department Center  09/08/2024  4:10 PM [facility] NURSE [facility] None    Keygan Dumond GORMAN Pouch, NP

## 2024-09-07 NOTE — Progress Notes (Signed)
 Pt is here for an acute visit for possible Bartholins cyst.  Had same issue in July of this year and was seen at [facility] for excision and drainage.  Discussed with A. Trudy, NP to determine plan of care.-Avey Mcmanamon, RN

## 2024-09-08 ENCOUNTER — Other Ambulatory Visit: Payer: Self-pay
